# Patient Record
Sex: Female | Born: 1985 | Race: Black or African American | Hispanic: No | Marital: Single | Smoking: Never smoker
Health system: Southern US, Community
[De-identification: ages and names within clinical notes are randomized; demographics above are authoritative.]

## PROBLEM LIST (undated history)

## (undated) ENCOUNTER — Inpatient Hospital Stay (HOSPITAL_COMMUNITY): Payer: Self-pay

## (undated) DIAGNOSIS — Z973 Presence of spectacles and contact lenses: Secondary | ICD-10-CM

## (undated) DIAGNOSIS — Z87440 Personal history of urinary (tract) infections: Secondary | ICD-10-CM

## (undated) DIAGNOSIS — R011 Cardiac murmur, unspecified: Secondary | ICD-10-CM

## (undated) DIAGNOSIS — B009 Herpesviral infection, unspecified: Secondary | ICD-10-CM

## (undated) DIAGNOSIS — D649 Anemia, unspecified: Secondary | ICD-10-CM

## (undated) DIAGNOSIS — R000 Tachycardia, unspecified: Secondary | ICD-10-CM

## (undated) DIAGNOSIS — O139 Gestational [pregnancy-induced] hypertension without significant proteinuria, unspecified trimester: Secondary | ICD-10-CM

## (undated) DIAGNOSIS — I10 Essential (primary) hypertension: Secondary | ICD-10-CM

## (undated) DIAGNOSIS — N920 Excessive and frequent menstruation with regular cycle: Secondary | ICD-10-CM

## (undated) DIAGNOSIS — Z8619 Personal history of other infectious and parasitic diseases: Secondary | ICD-10-CM

## (undated) HISTORY — PX: EXTERNAL EAR SURGERY: SHX627

## (undated) HISTORY — DX: Gestational (pregnancy-induced) hypertension without significant proteinuria, unspecified trimester: O13.9

## (undated) HISTORY — DX: Tachycardia, unspecified: R00.0

## (undated) HISTORY — DX: Personal history of other infectious and parasitic diseases: Z86.19

## (undated) HISTORY — DX: Essential (primary) hypertension: I10

## (undated) HISTORY — DX: Cardiac murmur, unspecified: R01.1

## (undated) HISTORY — DX: Personal history of urinary (tract) infections: Z87.440

---

## 2010-10-23 ENCOUNTER — Emergency Department (HOSPITAL_COMMUNITY)
Admission: EM | Admit: 2010-10-23 | Discharge: 2010-10-23 | Discharge status: Against Medical Advice | Payer: Medicaid Other | Attending: Emergency Medicine | Admitting: Emergency Medicine

## 2010-10-23 DIAGNOSIS — R109 Unspecified abdominal pain: Secondary | ICD-10-CM | POA: Insufficient documentation

## 2010-10-23 DIAGNOSIS — N898 Other specified noninflammatory disorders of vagina: Secondary | ICD-10-CM | POA: Insufficient documentation

## 2010-10-23 LAB — URINE MICROSCOPIC-ADD ON

## 2010-10-23 LAB — URINALYSIS, ROUTINE W REFLEX MICROSCOPIC
Bilirubin Urine: NEGATIVE
Glucose, UA: NEGATIVE mg/dL
Hgb urine dipstick: NEGATIVE
Ketones, ur: 15 mg/dL — AB
Nitrite: NEGATIVE
Protein, ur: NEGATIVE mg/dL
Specific Gravity, Urine: 1.028 (ref 1.005–1.030)
Urobilinogen, UA: 1 mg/dL (ref 0.0–1.0)
pH: 6.5 (ref 5.0–8.0)

## 2010-10-23 LAB — POCT PREGNANCY, URINE: Preg Test, Ur: NEGATIVE

## 2011-04-05 ENCOUNTER — Emergency Department (HOSPITAL_COMMUNITY)
Admission: EM | Admit: 2011-04-05 | Discharge: 2011-04-05 | Disposition: A | Discharge status: Home / Self Care | Payer: Self-pay | Attending: Emergency Medicine | Admitting: Emergency Medicine

## 2011-04-05 ENCOUNTER — Encounter (HOSPITAL_COMMUNITY): Payer: Self-pay | Admitting: Emergency Medicine

## 2011-04-05 DIAGNOSIS — H5789 Other specified disorders of eye and adnexa: Secondary | ICD-10-CM | POA: Insufficient documentation

## 2011-04-05 DIAGNOSIS — L259 Unspecified contact dermatitis, unspecified cause: Secondary | ICD-10-CM | POA: Insufficient documentation

## 2011-04-05 DIAGNOSIS — L239 Allergic contact dermatitis, unspecified cause: Secondary | ICD-10-CM

## 2011-04-05 MED ORDER — CETIRIZINE-PSEUDOEPHEDRINE ER 5-120 MG PO TB12: 1.0000 | ORAL_TABLET | Freq: Every day | ORAL | Status: AC

## 2011-04-05 NOTE — Discharge Instructions (Signed)
FOLLOW UP WITH DR. Vonna Kotyk FOR ANY PERSISTENT SYMPTOMS. TAKE ZYRTEC AS DIRECTED FOR SYMPTOMS. RETURN HERE WITH ANY SUDDEN WORSENING OF SYMPTOMS TO INCLUDE EYE PAIN, VISUAL CHANGES OR OTHER CONCERN.

## 2011-04-05 NOTE — ED Provider Notes (Signed)
History     CSN: [id number]  Arrival date & time 04/05/11  1145   First MD Initiated Contact with Patient 04/05/11 1231      Chief Complaint  Patient presents with  . Eye Problem    Left    (Consider location/radiation/quality/duration/timing/severity/associated sxs/prior treatment) Patient is a 26 y.o. female presenting with eye problem. The history is provided by the patient.  Eye Problem  This is a new problem. The current episode started 12 to 24 hours ago. The problem occurs constantly. The problem has been gradually worsening. There is pain in the left eye. There was no injury mechanism. There is no history of trauma to the eye. There is no known exposure to pink eye. She does not wear contacts. Pertinent negatives include no blurred vision, no decreased vision, no discharge, no foreign body sensation, no photophobia, no eye redness and no itching. Associated symptoms comments: She describes sensation of swelling or fullness to left upper eye lid. No symptoms of the eye itself. There is no pain or itching. She took Benadryl yesterday x 1 without change in symptoms. No other complaint. No rash..    History reviewed. No pertinent past medical history.  History reviewed. No pertinent past surgical history.  Family History  Problem Relation Age of Onset  . Hypertension Mother     History  Substance Use Topics  . Smoking status: Never Smoker   . Smokeless tobacco: Not on file  . Alcohol Use: Yes    OB History    Grav Para Term Preterm Abortions TAB SAB Ect Mult Living                  Review of Systems  Constitutional: Negative.   HENT: Negative for ear pain, congestion, rhinorrhea and ear discharge.   Eyes: Negative for blurred vision, photophobia, discharge and redness.       See HPI.  Skin: Negative for itching.    Allergies  Review of patient's allergies indicates no known allergies.  Home Medications   Current Outpatient Rx  Name Route Sig Dispense  Refill  . DIPHENHYDRAMINE HCL 25 MG PO CAPS Oral Take 25 mg by mouth every 6 (six) hours as needed. allergies    . OVER THE COUNTER MEDICATION        BP 126/76  Pulse 75  Temp(Src) 98.6 F (37 C) (Oral)  Resp 18  SpO2 98%  LMP 04/05/2011  Physical Exam  Constitutional: She is oriented to person, place, and time. She appears well-developed and well-nourished.  Eyes: Conjunctivae and EOM are normal. Pupils are equal, round, and reactive to light.       Slight swelling without redness or rash to left upper eye lid. No palpable mass, cyst or other abnormality.  Neck: Normal range of motion.  Pulmonary/Chest: Effort normal.  Neurological: She is alert and oriented to person, place, and time.  Skin: Skin is warm and dry.    ED Course  Procedures (including critical care time)  Labs Reviewed - No data to display No results found.   No diagnosis found.    MDM  No abnormality of the eye with the exception of mildly swollen upper lid. Favor allergic component over infection. Will treat with Zyrtec, and refer to ophtho for persistent symtpoms.        Rodena Medin, PA-C 04/05/11 1353

## 2011-04-07 NOTE — Progress Notes (Signed)
On 04/05/11 Pt listed as self pay with no insurance coverage Pt confirms she is self pay [place] resident CM and [facility] coordinator spoke with her Pt offered [facility] services to assist with finding a [place] self pay provider Pt accepted information

## 2011-04-22 NOTE — ED Provider Notes (Signed)
Evaluation and management procedures were performed by the PA/NP under my supervision/collaboration.   Lamount Bankson D Malasha Kleppe, MD 04/22/11 1014 

## 2012-04-11 ENCOUNTER — Emergency Department (HOSPITAL_COMMUNITY)
Admission: EM | Admit: 2012-04-11 | Discharge: 2012-04-11 | Disposition: A | Discharge status: Home / Self Care | Payer: Self-pay

## 2012-05-20 ENCOUNTER — Encounter (HOSPITAL_COMMUNITY): Payer: Self-pay | Admitting: *Deleted

## 2012-05-20 ENCOUNTER — Emergency Department (HOSPITAL_COMMUNITY)
Admission: EM | Admit: 2012-05-20 | Discharge: 2012-05-21 | Disposition: A | Discharge status: Home / Self Care | Payer: No Typology Code available for payment source | Attending: Emergency Medicine | Admitting: Emergency Medicine

## 2012-05-20 DIAGNOSIS — S4980XA Other specified injuries of shoulder and upper arm, unspecified arm, initial encounter: Secondary | ICD-10-CM | POA: Insufficient documentation

## 2012-05-20 DIAGNOSIS — N6489 Other specified disorders of breast: Secondary | ICD-10-CM

## 2012-05-20 DIAGNOSIS — Y92410 Unspecified street and highway as the place of occurrence of the external cause: Secondary | ICD-10-CM | POA: Insufficient documentation

## 2012-05-20 DIAGNOSIS — M79602 Pain in left arm: Secondary | ICD-10-CM

## 2012-05-20 DIAGNOSIS — Y939 Activity, unspecified: Secondary | ICD-10-CM | POA: Insufficient documentation

## 2012-05-20 DIAGNOSIS — S46909A Unspecified injury of unspecified muscle, fascia and tendon at shoulder and upper arm level, unspecified arm, initial encounter: Secondary | ICD-10-CM | POA: Insufficient documentation

## 2012-05-20 DIAGNOSIS — S2000XA Contusion of breast, unspecified breast, initial encounter: Secondary | ICD-10-CM | POA: Insufficient documentation

## 2012-05-20 MED ORDER — NAPROXEN 500 MG PO TABS: 500.0000 mg | ORAL_TABLET | Freq: Two times a day (BID) | ORAL | Status: DC

## 2012-05-20 MED ORDER — TRAMADOL HCL 50 MG PO TABS: 50.0000 mg | ORAL_TABLET | Freq: Four times a day (QID) | ORAL | Status: DC | PRN
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
## 2012-05-22 NOTE — ED Provider Notes (Signed)
Medical screening examination/treatment/procedure(s) were performed by non-physician practitioner and as supervising physician I was immediately available for consultation/collaboration.  Brian Opitz, MD 05/22/12 1814 

## 2012-08-16 ENCOUNTER — Encounter: Payer: Self-pay | Admitting: Obstetrics & Gynecology

## 2012-09-09 ENCOUNTER — Emergency Department (HOSPITAL_COMMUNITY)
Admission: EM | Admit: 2012-09-09 | Discharge: 2012-09-10 | Disposition: A | Discharge status: Home / Self Care | Payer: BC Managed Care – PPO | Attending: Emergency Medicine | Admitting: Emergency Medicine

## 2012-09-09 DIAGNOSIS — Z79899 Other long term (current) drug therapy: Secondary | ICD-10-CM | POA: Insufficient documentation

## 2012-09-09 DIAGNOSIS — M549 Dorsalgia, unspecified: Secondary | ICD-10-CM | POA: Insufficient documentation

## 2012-09-09 DIAGNOSIS — N390 Urinary tract infection, site not specified: Secondary | ICD-10-CM | POA: Insufficient documentation

## 2012-09-09 DIAGNOSIS — Z3202 Encounter for pregnancy test, result negative: Secondary | ICD-10-CM | POA: Insufficient documentation

## 2012-09-09 DIAGNOSIS — R300 Dysuria: Secondary | ICD-10-CM | POA: Insufficient documentation

## 2012-09-10 ENCOUNTER — Encounter (HOSPITAL_COMMUNITY): Payer: Self-pay

## 2012-09-10 LAB — URINE MICROSCOPIC-ADD ON

## 2012-09-10 LAB — URINALYSIS, ROUTINE W REFLEX MICROSCOPIC
Bilirubin Urine: NEGATIVE
Glucose, UA: NEGATIVE mg/dL
Hgb urine dipstick: NEGATIVE
Ketones, ur: NEGATIVE mg/dL
Nitrite: NEGATIVE
Protein, ur: NEGATIVE mg/dL
Specific Gravity, Urine: 1.032 — ABNORMAL HIGH (ref 1.005–1.030)
Urobilinogen, UA: 1 mg/dL (ref 0.0–1.0)
pH: 5.5 (ref 5.0–8.0)

## 2012-09-10 LAB — POCT PREGNANCY, URINE: Preg Test, Ur: NEGATIVE

## 2012-09-10 MED ORDER — PHENAZOPYRIDINE HCL 200 MG PO TABS: 200.0000 mg | ORAL_TABLET | Freq: Three times a day (TID) | ORAL | Status: DC | PRN

## 2012-09-10 MED ORDER — NITROFURANTOIN MONOHYD MACRO 100 MG PO CAPS: 100.0000 mg | ORAL_CAPSULE | Freq: Two times a day (BID) | ORAL | Status: DC

## 2012-09-10 NOTE — ED Provider Notes (Signed)
CSN: [id number]     Arrival date & time 09/09/12  2356 History     First MD Initiated Contact with Patient 09/10/12 0113     Chief Complaint  Patient presents with  . Flank Pain   HPI  History provided by the patient. Patient is a 27 year old female with no significant PMH who presents with complaints of pain and ache in her left lower back and side. Symptoms have been present for the past 2 weeks. She states there were very mild. They do not seem sniffily worse with any particular movements or position. Over the past one to 2 days she began having a dysuria associated with the back pain and discomfort. She denies similar symptoms previously. She is concerned for possible urine infection. She denies any fever, chills or sweats. No nausea, vomiting, diarrhea or constipation. She reports having slight vaginal discharge that is mild and clear. Denies any vaginal pain or bleeding. No menstrual changes. No other aggravating or alleviating factors.    History reviewed. No pertinent past medical history. Past Surgical History  Procedure Laterality Date  . External ear surgery      keloids   Family History  Problem Relation Age of Onset  . Hypertension Mother    History  Substance Use Topics  . Smoking status: Never Smoker   . Smokeless tobacco: Not on file  . Alcohol Use: Yes     Comment: social   OB History   Grav Para Term Preterm Abortions TAB SAB Ect Mult Living                 Review of Systems  Constitutional: Negative for fever, chills and diaphoresis.  Gastrointestinal: Negative for nausea, vomiting, abdominal pain, diarrhea and constipation.  Genitourinary: Positive for dysuria and flank pain. Negative for frequency, hematuria, vaginal bleeding, menstrual problem and pelvic pain.  Musculoskeletal: Positive for back pain.  All other systems reviewed and are negative.    Allergies  Review of patient's allergies indicates no known allergies.  Home Medications    Current Outpatient Rx  Name  Route  Sig  Dispense  Refill  . acidophilus (RISAQUAD) CAPS capsule   Oral   Take 1 capsule by mouth every morning.         . fluconazole (DIFLUCAN) 150 MG tablet   Oral   Take 150 mg by mouth once.          BP 121/70  Pulse 87  Temp(Src) 99.8 F (37.7 C) (Oral)  Resp 18  SpO2 100%  LMP 09/03/2012 Physical Exam  Nursing note and vitals reviewed. Constitutional: She is oriented to person, place, and time. She appears well-developed and well-nourished. No distress.  HENT:  Head: Normocephalic.  Cardiovascular: Normal rate and regular rhythm.   Pulmonary/Chest: Effort normal and breath sounds normal. No respiratory distress. She has no wheezes. She has no rales.  Abdominal: Soft. She exhibits no distension. There is no tenderness. There is no rebound and no guarding.  No significant CVA tenderness  Musculoskeletal: Normal range of motion. She exhibits no edema and no tenderness.  No spinal or other back pain  Neurological: She is alert and oriented to person, place, and time.  Skin: Skin is warm and dry. No rash noted.  Psychiatric: She has a normal mood and affect. Her behavior is normal.    ED Course   Procedures   Results for orders placed during the hospital encounter of 09/09/12  URINALYSIS, ROUTINE W REFLEX MICROSCOPIC  Result Value Range   Color, Urine YELLOW  YELLOW   APPearance CLOUDY (*) CLEAR   Specific Gravity, Urine 1.032 (*) 1.005 - 1.030   pH 5.5  5.0 - 8.0   Glucose, UA NEGATIVE  NEGATIVE mg/dL   Hgb urine dipstick NEGATIVE  NEGATIVE   Bilirubin Urine NEGATIVE  NEGATIVE   Ketones, ur NEGATIVE  NEGATIVE mg/dL   Protein, ur NEGATIVE  NEGATIVE mg/dL   Urobilinogen, UA 1.0  0.0 - 1.0 mg/dL   Nitrite NEGATIVE  NEGATIVE   Leukocytes, UA MODERATE (*) NEGATIVE  URINE MICROSCOPIC-ADD ON      Result Value Range   Squamous Epithelial / LPF FEW (*) RARE   WBC, UA 21-50  <3 WBC/hpf   Bacteria, UA FEW (*) RARE    Urine-Other MUCOUS PRESENT    POCT PREGNANCY, URINE      Result Value Range   Preg Test, Ur NEGATIVE  NEGATIVE      1. UTI (lower urinary tract infection)     MDM  Patient seen and evaluated. The patient appears well in no acute distress. She does not appear severely ill or toxic. She has some mild left flank discomforts with dysuria. Patient states she was tested for STDs with her OB/GYN earlier this month has not been essentially active since that time does not wish for a pelvic examination as part of her workup.  Angus Seller, PA-C 09/10/12 [phone]

## 2012-09-10 NOTE — ED Notes (Signed)
Pt states left flank pain for 2 weeks.  Today started having pain with urination.  Slight fever in triage.  Slight discharge.  No n/v/d.  Did not have fever at home.

## 2012-09-10 NOTE — ED Provider Notes (Signed)
Medical screening examination/treatment/procedure(s) were performed by non-physician practitioner and as supervising physician I was immediately available for consultation/collaboration.  Olga M Otter, MD 09/10/12 0607 

## 2012-09-11 LAB — URINE CULTURE: Colony Count: 90000

## 2012-12-12 LAB — OB RESULTS CONSOLE ANTIBODY SCREEN: Antibody Screen: NEGATIVE

## 2012-12-12 LAB — OB RESULTS CONSOLE ABO/RH: RH Type: POSITIVE

## 2012-12-12 LAB — OB RESULTS CONSOLE VARICELLA ZOSTER ANTIBODY, IGG: Varicella: NON-IMMUNE/NOT IMMUNE

## 2012-12-12 LAB — OB RESULTS CONSOLE HIV ANTIBODY (ROUTINE TESTING): HIV: NONREACTIVE

## 2012-12-12 LAB — OB RESULTS CONSOLE RPR: RPR: NONREACTIVE

## 2012-12-12 LAB — OB RESULTS CONSOLE HEPATITIS B SURFACE ANTIGEN: Hepatitis B Surface Ag: NEGATIVE

## 2012-12-12 LAB — OB RESULTS CONSOLE RUBELLA ANTIBODY, IGM: Rubella: IMMUNE

## 2013-01-22 ENCOUNTER — Other Ambulatory Visit (HOSPITAL_COMMUNITY): Payer: Self-pay | Admitting: Nurse Practitioner

## 2013-01-22 DIAGNOSIS — Z3689 Encounter for other specified antenatal screening: Secondary | ICD-10-CM

## 2013-01-25 NOTE — L&D Delivery Note (Signed)
Delivery Note Called to patient room for delivery. Patient pushed over intact perineum. At 1:09 AM a viable female was delivered via Vaginal, Spontaneous Delivery (Presentation: ; Occiput Anterior).  APGAR: 8, 9; weight pending. Cord clamped x 2 and cut. Active management 3rd stage with traction, Placenta status: Intact, Spontaneous.  Cord: 3 vessel  with the following complications: None.  1st degree perineal/sulcal tear repaired in usual fashion as noted below, hemostatic. Cord pH: n/a. Counts correct. Hemostatic. CNM present for delivery.   Anesthesia: Epidural  Episiotomy: None Lacerations: 1st degree;Perineal Suture Repair: 2.0 vicryl, Est. Blood Loss (mL): 200  Mom to postpartum.  Baby to Couplet care / Skin to Skin.  Emeterio Reeve 07/16/2013, 1:31 AM

## 2013-01-25 NOTE — L&D Delivery Note (Signed)
Agree with asessment

## 2013-02-20 ENCOUNTER — Emergency Department (HOSPITAL_COMMUNITY)
Admission: AD | Admit: 2013-02-20 | Discharge: 2013-02-20 | Disposition: A | Discharge status: Home / Self Care | Payer: BC Managed Care – PPO | Source: Ambulatory Visit | Attending: Obstetrics & Gynecology | Admitting: Obstetrics & Gynecology

## 2013-02-20 ENCOUNTER — Encounter (HOSPITAL_COMMUNITY): Payer: Self-pay | Admitting: *Deleted

## 2013-02-20 ENCOUNTER — Encounter (HOSPITAL_COMMUNITY): Payer: Self-pay | Admitting: Emergency Medicine

## 2013-02-20 ENCOUNTER — Inpatient Hospital Stay (EMERGENCY_DEPARTMENT_HOSPITAL)
Admission: AD | Admit: 2013-02-20 | Discharge: 2013-02-20 | Disposition: A | Discharge status: Home / Self Care | Payer: BC Managed Care – PPO | Source: Ambulatory Visit | Attending: Obstetrics & Gynecology | Admitting: Obstetrics & Gynecology

## 2013-02-20 DIAGNOSIS — R1013 Epigastric pain: Secondary | ICD-10-CM

## 2013-02-20 DIAGNOSIS — O209 Hemorrhage in early pregnancy, unspecified: Secondary | ICD-10-CM | POA: Insufficient documentation

## 2013-02-20 DIAGNOSIS — O2390 Unspecified genitourinary tract infection in pregnancy, unspecified trimester: Secondary | ICD-10-CM | POA: Insufficient documentation

## 2013-02-20 DIAGNOSIS — R109 Unspecified abdominal pain: Secondary | ICD-10-CM | POA: Insufficient documentation

## 2013-02-20 DIAGNOSIS — O9989 Other specified diseases and conditions complicating pregnancy, childbirth and the puerperium: Secondary | ICD-10-CM

## 2013-02-20 DIAGNOSIS — B373 Candidiasis of vulva and vagina: Secondary | ICD-10-CM | POA: Insufficient documentation

## 2013-02-20 DIAGNOSIS — B3731 Acute candidiasis of vulva and vagina: Secondary | ICD-10-CM | POA: Insufficient documentation

## 2013-02-20 LAB — URINE MICROSCOPIC-ADD ON

## 2013-02-20 LAB — COMPREHENSIVE METABOLIC PANEL
ALT: 31 U/L (ref 0–35)
AST: 36 U/L (ref 0–37)
Albumin: 3 g/dL — ABNORMAL LOW (ref 3.5–5.2)
Alkaline Phosphatase: 83 U/L (ref 39–117)
BUN: 9 mg/dL (ref 6–23)
CO2: 22 mEq/L (ref 19–32)
Calcium: 9.1 mg/dL (ref 8.4–10.5)
Chloride: 104 mEq/L (ref 96–112)
Creatinine, Ser: 0.63 mg/dL (ref 0.50–1.10)
GFR calc Af Amer: >90 mL/min (ref >90)
GFR calc non Af Amer: >90 mL/min (ref >90)
Glucose, Bld: 86 mg/dL (ref 70–99)
Potassium: 4 mEq/L (ref 3.7–5.3)
Sodium: 139 mEq/L (ref 137–147)
Total Bilirubin: <0.2 mg/dL — ABNORMAL LOW (ref 0.3–1.2)
Total Protein: 7.8 g/dL (ref 6.0–8.3)

## 2013-02-20 LAB — CBC WITH DIFFERENTIAL/PLATELET
Basophils Absolute: 0 10*3/uL (ref 0.0–0.1)
Basophils Relative: 0 % (ref 0–1)
Eosinophils Absolute: 0.1 10*3/uL (ref 0.0–0.7)
Eosinophils Relative: 1 % (ref 0–5)
HCT: 31.3 % — ABNORMAL LOW (ref 36.0–46.0)
Hemoglobin: 9.3 g/dL — ABNORMAL LOW (ref 12.0–15.0)
Lymphocytes Relative: 13 % (ref 12–46)
Lymphs Abs: 1.1 10*3/uL (ref 0.7–4.0)
MCH: 17 pg — ABNORMAL LOW (ref 26.0–34.0)
MCHC: 29.7 g/dL — ABNORMAL LOW (ref 30.0–36.0)
MCV: 57.1 fL — ABNORMAL LOW (ref 78.0–100.0)
Monocytes Absolute: 0.6 10*3/uL (ref 0.1–1.0)
Monocytes Relative: 7 % (ref 3–12)
Neutro Abs: 6.8 10*3/uL (ref 1.7–7.7)
Neutrophils Relative %: 79 % — ABNORMAL HIGH (ref 43–77)
Platelets: 303 10*3/uL (ref 150–400)
RBC: 5.48 MIL/uL — ABNORMAL HIGH (ref 3.87–5.11)
RDW: 23.2 % — ABNORMAL HIGH (ref 11.5–15.5)
WBC: 8.6 10*3/uL (ref 4.0–10.5)

## 2013-02-20 LAB — URINALYSIS, ROUTINE W REFLEX MICROSCOPIC
Bilirubin Urine: NEGATIVE
Glucose, UA: NEGATIVE mg/dL
Ketones, ur: NEGATIVE mg/dL
Nitrite: NEGATIVE
Protein, ur: NEGATIVE mg/dL
Specific Gravity, Urine: 1.03 (ref 1.005–1.030)
Urobilinogen, UA: 2 mg/dL — ABNORMAL HIGH (ref 0.0–1.0)
pH: 6 (ref 5.0–8.0)

## 2013-02-20 LAB — POCT PREGNANCY, URINE: Preg Test, Ur: POSITIVE — AB

## 2013-02-20 NOTE — ED Notes (Addendum)
Pt reports [redacted] weeks pregnant, started having vaginal bleeding/ spotting starting today. Upper abdominal cramping 10/10.  Reports first pregnancy.

## 2013-02-20 NOTE — ED Notes (Signed)
Pt states that she has been seen at health dept and told she is [redacted] weeks pregnant.  Today while at work she started having upper abd pain "like a bad menstrual period but in wrong place" and then noticed vaginal small amounts of pink blood.

## 2013-02-20 NOTE — MAU Note (Signed)
PT SAYS SHE WAS SEEN AT [facility]   ON  02-02-2013.  HAS AN APPOINTMENT  HERE FOR U/S ON MON.   SAYS  SHE HAS UPPER ABD  PAIN - THAT STARTED  ON Sunday NIGHT - WENT AWAY.  THEN NO PAIN ON Monday.  BUT TODAY  AT 330PM - WHILE AT WORK - THE  PAIN CAME BACK-  NO MEDS TAKEN.    PAIN IS LESS NOW THAN AT 330PM.    DENIES N/V/D.     SAYS SHE  HAS BEEN SPOTTING  SINCE 1-20-  LIGHT PINK.Marland Kitchen HAS CONTINUED - HAS ON PANTY LINER.     LAST SEX- 1-13.      IN TRIAGE - ON PANTY LINER - NOTHING-  BECAUSE SHE SAYS SHE CHANGED IT  WHILE  SHE WAS [facility]-  SHE WAS THERE FROM 4- 630-  THEY DREW BLOOD AND  DID VITALS.

## 2013-02-20 NOTE — Discharge Instructions (Signed)
Heartburn During Pregnancy  Heartburn happens when stomach acid goes up into the esophagus. The esophagus is the tube between the mouth and the stomach. This acid causes a burning pain in the chest or throat. This happens more often in the later part of pregnancy because the womb (uterus) gets larger. It may also happen because of hormone changes. Heartburn problems often go away after giving birth. HOME CARE  Take all medicine as told by your doctor.  Raise the head of your bed with blocks only as told by your doctor.  Do not exercise right after eating.  Avoid eating 2 3 hours before bed. Do not lie down right after eating.  Eat small meals throughout the day instead of 3 large meals.  Avoid foods that give you heartburn. Foods you may want to avoid include:  Peppers.  Chocolate.  High-fat foods, including fried foods.  Spicy foods.  Garlic and onions.  Citrus fruits, including oranges, grapefruit, lemons, and limes.  Food containing tomatoes or tomato products.  Mint.  Bubbly (carbonated) drinks and drinks with caffeine.  Vinegar. GET HELP IF:  You have any belly (abdominal) pain.  You feel burning in your upper belly or chest, especially after eating or lying down.  You feel sick to your stomach (nauseous) and throw up (vomit).  Your stomach feels upset after you eat. GET HELP RIGHT AWAY IF:  You have bad chest pain that goes down your arm or into your jaw or neck.  You feel sweaty, dizzy, or lightheaded.  You have trouble breathing.  You throw up blood.  You have trouble or pain when swallowing.  You have bloody or black poop (stool).  You have heartburn more than 3 times a week, for more than 2 weeks. MAKE SURE YOU:  Understand these instructions.  Will watch your condition.  Will get help right away if you are not doing well or get worse. Document Released: 02/13/2010 Document Revised: 11/01/2012 Document Reviewed: 08/30/2012 [facility] Patient  Information 2014 [place], [place].

## 2013-02-20 NOTE — MAU Provider Note (Signed)
History     CSN: [id number]  Arrival date and time: 02/20/13 1901   First Provider Initiated Contact with Patient 02/20/13 2117      Chief Complaint  Patient presents with  . Vaginal Bleeding  . Abdominal Pain   HPI [name] is a 28 y.o. G2P0010 at [redacted]w[redacted]d who presents to MAU today with complaint of upper abdominal pain and vaginal spotting. The patient gets prenatal care at [facility]. She states that she noted sharp upper abdominal pains earlier today. She denies heartburn, reflux, N/V/D or constipation, change in appetite or fever. She states that the pain has resolved at this time. She went to [facility] first and had blood drawn and left before being seen. The patient also noted occasional spotting with wiping. She is currently being treated for a yeast infection with vaginal Terazol cream. Last intercourse was 2 weeks ago. Last appointment at [facility] on 02/02/13. Next appointment is next week.   OB History   Grav Para Term Preterm Abortions TAB SAB Ect Mult Living   2    1 1           History reviewed. No pertinent past medical history.  Past Surgical History  Procedure Laterality Date  . External ear surgery      keloids    Family History  Problem Relation Age of Onset  . Hypertension Mother     History  Substance Use Topics  . Smoking status: Never Smoker   . Smokeless tobacco: Not on file  . Alcohol Use: No     Comment: social    Allergies: No Known Allergies  Prescriptions prior to admission  Medication Sig Dispense Refill  . Prenatal Vit-Fe Fumarate-FA (PRENATAL MULTIVITAMIN) TABS tablet Take 1 tablet by mouth daily at 12 noon.      Marland Kitchen terconazole (TERAZOL 7) 0.4 % vaginal cream Place 1 applicator vaginally at bedtime.        Review of Systems  Constitutional: Negative for fever and malaise/fatigue.  Gastrointestinal: Positive for abdominal pain. Negative for nausea, vomiting, diarrhea and constipation.  Genitourinary: Negative for dysuria, urgency and  frequency.       + vaginal bleeding Neg - vaginal discharge  Neurological: Positive for dizziness. Negative for loss of consciousness.   Physical Exam   Blood pressure 122/63, pulse 79, temperature 98.2 F (36.8 C), temperature source Oral, resp. rate 20, height 4\' 11"  (1.499 m), weight 198 lb 8 oz (90.039 kg), last menstrual period 10/15/2012.  Physical Exam  Constitutional: She is oriented to person, place, and time. She appears well-developed and well-nourished. No distress.  HENT:  Head: Normocephalic and atraumatic.  Cardiovascular: Normal rate, regular rhythm and normal heart sounds.   Respiratory: Effort normal and breath sounds normal. No respiratory distress.  GI: Soft. Bowel sounds are normal. She exhibits no distension and no mass. There is no tenderness. There is no rebound and no guarding.  Neurological: She is alert and oriented to person, place, and time.  Skin: Skin is warm and dry. No erythema.  Psychiatric: She has a normal mood and affect.   Results for orders placed during the hospital encounter of 02/20/13 (from the past 24 hour(s))  URINALYSIS, ROUTINE W REFLEX MICROSCOPIC     Status: Abnormal   Collection Time    02/20/13  5:46 PM      Result Value Range   Color, Urine AMBER (*) YELLOW   APPearance CLOUDY (*) CLEAR   Specific Gravity, Urine 1.030  1.005 - 1.030  pH 6.0  5.0 - 8.0   Glucose, UA NEGATIVE  NEGATIVE mg/dL   Hgb urine dipstick TRACE (*) NEGATIVE   Bilirubin Urine NEGATIVE  NEGATIVE   Ketones, ur NEGATIVE  NEGATIVE mg/dL   Protein, ur NEGATIVE  NEGATIVE mg/dL   Urobilinogen, UA 2.0 (*) 0.0 - 1.0 mg/dL   Nitrite NEGATIVE  NEGATIVE   Leukocytes, UA LARGE (*) NEGATIVE  URINE MICROSCOPIC-ADD ON     Status: Abnormal   Collection Time    02/20/13  5:46 PM      Result Value Range   Squamous Epithelial / LPF MANY (*) RARE   WBC, UA 21-50  <3 WBC/hpf   RBC / HPF 0-2  <3 RBC/hpf   Bacteria, UA RARE  RARE   Crystals CA OXALATE CRYSTALS (*)  NEGATIVE  POCT PREGNANCY, URINE     Status: Abnormal   Collection Time    02/20/13  5:47 PM      Result Value Range   Preg Test, Ur POSITIVE (*) NEGATIVE  CBC WITH DIFFERENTIAL     Status: Abnormal   Collection Time    02/20/13  6:01 PM      Result Value Range   WBC 8.6  4.0 - 10.5 K/uL   RBC 5.48 (*) 3.87 - 5.11 MIL/uL   Hemoglobin 9.3 (*) 12.0 - 15.0 g/dL   HCT 31.3 (*) 36.0 - 46.0 %   MCV 57.1 (*) 78.0 - 100.0 fL   MCH 17.0 (*) 26.0 - 34.0 pg   MCHC 29.7 (*) 30.0 - 36.0 g/dL   RDW 23.2 (*) 11.5 - 15.5 %   Platelets 303  150 - 400 K/uL   Neutrophils Relative % 79 (*) 43 - 77 %   Lymphocytes Relative 13  12 - 46 %   Monocytes Relative 7  3 - 12 %   Eosinophils Relative 1  0 - 5 %   Basophils Relative 0  0 - 1 %   Neutro Abs 6.8  1.7 - 7.7 K/uL   Lymphs Abs 1.1  0.7 - 4.0 K/uL   Monocytes Absolute 0.6  0.1 - 1.0 K/uL   Eosinophils Absolute 0.1  0.0 - 0.7 K/uL   Basophils Absolute 0.0  0.0 - 0.1 K/uL   RBC Morphology POLYCHROMASIA PRESENT     Smear Review PLATELET COUNT CONFIRMED BY SMEAR    COMPREHENSIVE METABOLIC PANEL     Status: Abnormal   Collection Time    02/20/13  6:01 PM      Result Value Range   Sodium 139  137 - 147 mEq/L   Potassium 4.0  3.7 - 5.3 mEq/L   Chloride 104  96 - 112 mEq/L   CO2 22  19 - 32 mEq/L   Glucose, Bld 86  70 - 99 mg/dL   BUN 9  6 - 23 mg/dL   Creatinine, Ser 0.63  0.50 - 1.10 mg/dL   Calcium 9.1  8.4 - 10.5 mg/dL   Total Protein 7.8  6.0 - 8.3 g/dL   Albumin 3.0 (*) 3.5 - 5.2 g/dL   AST 36  0 - 37 U/L   ALT 31  0 - 35 U/L   Alkaline Phosphatase 83  39 - 117 U/L   Total Bilirubin <0.2 (*) 0.3 - 1.2 mg/dL   GFR calc non Af Amer >90  >90 mL/min   GFR calc Af Amer >90  >90 mL/min     MAU Course  Procedures None  MDM UA, CBC,  CMP  FHR - 153 bpm Pelvic exam deferred as patient is currently being treated with vaginal terazol cream Patient reassured of fetal well-being with doppler FHR Upper abdominal pain as resolved. LFTs were  normal on CMP. WBC normal on CBC today  Assessment and Plan  A: Yeast vulvovaginitis Vaginal bleeding in pregnancy , second trimester Abdominal pain, resolved  P: Discharge home Advised Pepcid or Zantac if upper abdominal pain returns Advised patient to complete course of Terazol cream as prescribed Keep scheduled follow-up for [place] and prenatal care with [facility] Bleeding precautions discussed Patient may return to MAU as needed or if her condition were to change or worsen  Farris Has, PA-C  02/20/2013, 9:17 PM

## 2013-02-26 ENCOUNTER — Ambulatory Visit (HOSPITAL_COMMUNITY)
Admission: RE | Admit: 2013-02-26 | Discharge: 2013-02-26 | Disposition: A | Discharge status: Home / Self Care | Payer: BC Managed Care – PPO | Source: Ambulatory Visit | Attending: Nurse Practitioner | Admitting: Nurse Practitioner

## 2013-02-26 DIAGNOSIS — Z3689 Encounter for other specified antenatal screening: Secondary | ICD-10-CM

## 2013-03-02 ENCOUNTER — Other Ambulatory Visit (HOSPITAL_COMMUNITY): Payer: Self-pay | Admitting: Nurse Practitioner

## 2013-03-02 DIAGNOSIS — Z0489 Encounter for examination and observation for other specified reasons: Secondary | ICD-10-CM

## 2013-03-02 DIAGNOSIS — IMO0002 Reserved for concepts with insufficient information to code with codable children: Secondary | ICD-10-CM

## 2013-03-11 LAB — OB RESULTS CONSOLE GC/CHLAMYDIA
Chlamydia: NEGATIVE
Gonorrhea: NEGATIVE

## 2013-04-02 ENCOUNTER — Other Ambulatory Visit (HOSPITAL_COMMUNITY): Payer: Self-pay | Admitting: Nurse Practitioner

## 2013-04-02 DIAGNOSIS — Z0489 Encounter for examination and observation for other specified reasons: Secondary | ICD-10-CM

## 2013-04-02 DIAGNOSIS — IMO0002 Reserved for concepts with insufficient information to code with codable children: Secondary | ICD-10-CM

## 2013-04-09 ENCOUNTER — Ambulatory Visit (HOSPITAL_COMMUNITY)
Admission: RE | Admit: 2013-04-09 | Discharge: 2013-04-09 | Disposition: A | Discharge status: Home / Self Care | Payer: BC Managed Care – PPO | Source: Ambulatory Visit | Attending: Nurse Practitioner | Admitting: Nurse Practitioner

## 2013-04-09 DIAGNOSIS — Z0489 Encounter for examination and observation for other specified reasons: Secondary | ICD-10-CM

## 2013-04-09 DIAGNOSIS — IMO0002 Reserved for concepts with insufficient information to code with codable children: Secondary | ICD-10-CM

## 2013-04-09 DIAGNOSIS — Z3689 Encounter for other specified antenatal screening: Secondary | ICD-10-CM | POA: Insufficient documentation

## 2013-05-16 ENCOUNTER — Other Ambulatory Visit (HOSPITAL_COMMUNITY): Payer: Self-pay | Admitting: Nurse Practitioner

## 2013-05-16 DIAGNOSIS — O26849 Uterine size-date discrepancy, unspecified trimester: Secondary | ICD-10-CM

## 2013-05-22 ENCOUNTER — Ambulatory Visit (HOSPITAL_COMMUNITY)
Admission: RE | Admit: 2013-05-22 | Discharge: 2013-05-22 | Disposition: A | Discharge status: Home / Self Care | Payer: BC Managed Care – PPO | Source: Ambulatory Visit | Attending: Nurse Practitioner | Admitting: Nurse Practitioner

## 2013-05-22 DIAGNOSIS — Z3689 Encounter for other specified antenatal screening: Secondary | ICD-10-CM | POA: Insufficient documentation

## 2013-05-22 DIAGNOSIS — O26849 Uterine size-date discrepancy, unspecified trimester: Secondary | ICD-10-CM | POA: Insufficient documentation

## 2013-06-28 LAB — OB RESULTS CONSOLE GBS: GBS: POSITIVE

## 2013-07-12 ENCOUNTER — Inpatient Hospital Stay (HOSPITAL_COMMUNITY)
Admission: AD | Admit: 2013-07-12 | Discharge: 2013-07-12 | Disposition: A | Discharge status: Home / Self Care | Payer: BC Managed Care – PPO | Source: Ambulatory Visit | Attending: Obstetrics & Gynecology | Admitting: Obstetrics & Gynecology

## 2013-07-12 ENCOUNTER — Encounter (HOSPITAL_COMMUNITY): Payer: Self-pay | Admitting: *Deleted

## 2013-07-12 DIAGNOSIS — O139 Gestational [pregnancy-induced] hypertension without significant proteinuria, unspecified trimester: Secondary | ICD-10-CM | POA: Insufficient documentation

## 2013-07-12 DIAGNOSIS — O133 Gestational [pregnancy-induced] hypertension without significant proteinuria, third trimester: Secondary | ICD-10-CM

## 2013-07-12 HISTORY — DX: Anemia, unspecified: D64.9

## 2013-07-12 HISTORY — DX: Herpesviral infection, unspecified: B00.9

## 2013-07-12 LAB — URINALYSIS, ROUTINE W REFLEX MICROSCOPIC
Bilirubin Urine: NEGATIVE
Glucose, UA: NEGATIVE mg/dL
Hgb urine dipstick: NEGATIVE
Ketones, ur: NEGATIVE mg/dL
Nitrite: NEGATIVE
Protein, ur: NEGATIVE mg/dL
Specific Gravity, Urine: 1.02 (ref 1.005–1.030)
Urobilinogen, UA: 1 mg/dL (ref 0.0–1.0)
pH: 6 (ref 5.0–8.0)

## 2013-07-12 LAB — CBC WITH DIFFERENTIAL/PLATELET
Basophils Absolute: 0 10*3/uL (ref 0.0–0.1)
Basophils Relative: 0 % (ref 0–1)
Eosinophils Absolute: 0.1 10*3/uL (ref 0.0–0.7)
Eosinophils Relative: 1 % (ref 0–5)
HCT: 30.3 % — ABNORMAL LOW (ref 36.0–46.0)
Hemoglobin: 8.9 g/dL — ABNORMAL LOW (ref 12.0–15.0)
Lymphocytes Relative: 15 % (ref 12–46)
Lymphs Abs: 1.2 10*3/uL (ref 0.7–4.0)
MCH: 19.3 pg — ABNORMAL LOW (ref 26.0–34.0)
MCHC: 29.4 g/dL — ABNORMAL LOW (ref 30.0–36.0)
MCV: 65.7 fL — ABNORMAL LOW (ref 78.0–100.0)
Monocytes Absolute: 0.6 10*3/uL (ref 0.1–1.0)
Monocytes Relative: 8 % (ref 3–12)
Neutro Abs: 5.8 10*3/uL (ref 1.7–7.7)
Neutrophils Relative %: 76 % (ref 43–77)
Platelets: 223 10*3/uL (ref 150–400)
RBC: 4.61 MIL/uL (ref 3.87–5.11)
RDW: 30.7 % — ABNORMAL HIGH (ref 11.5–15.5)
WBC: 7.6 10*3/uL (ref 4.0–10.5)

## 2013-07-12 LAB — PROTEIN / CREATININE RATIO, URINE
Creatinine, Urine: 255.07 mg/dL
Protein Creatinine Ratio: 0.09 (ref 0.00–0.15)
Total Protein, Urine: 22.6 mg/dL

## 2013-07-12 LAB — COMPREHENSIVE METABOLIC PANEL
ALT: 11 U/L (ref 0–35)
AST: 20 U/L (ref 0–37)
Albumin: 2.4 g/dL — ABNORMAL LOW (ref 3.5–5.2)
Alkaline Phosphatase: 162 U/L — ABNORMAL HIGH (ref 39–117)
BUN: 7 mg/dL (ref 6–23)
CO2: 22 mEq/L (ref 19–32)
Calcium: 8.4 mg/dL (ref 8.4–10.5)
Chloride: 102 mEq/L (ref 96–112)
Creatinine, Ser: 0.61 mg/dL (ref 0.50–1.10)
GFR calc Af Amer: >90 mL/min (ref >90)
GFR calc non Af Amer: >90 mL/min (ref >90)
Glucose, Bld: 86 mg/dL (ref 70–99)
Potassium: 3.8 mEq/L (ref 3.7–5.3)
Sodium: 136 mEq/L — ABNORMAL LOW (ref 137–147)
Total Bilirubin: 0.3 mg/dL (ref 0.3–1.2)
Total Protein: 6.1 g/dL (ref 6.0–8.3)

## 2013-07-12 LAB — URINE MICROSCOPIC-ADD ON

## 2013-07-12 NOTE — Discharge Instructions (Signed)

## 2013-07-12 NOTE — MAU Provider Note (Signed)
Chief Complaint:  Hypertension   Caroline Jordan is a 28 y.o.  G1P0000 with IUP at [redacted]w[redacted]d presenting for Hypertension Patient receives care at the [facility]. She had an elevated BP 140's/90's today and was sent to MAU for evaluation. She denies HA, visual changes, RUQ pain but does have swelling in her LE but nothing acutely changed.   Is taking valtrex for HSV infection.    Denies LOF or VB with Good FM.   Menstrual History: OB History   Grav Para Term Preterm Abortions TAB SAB Ect Mult Living   1    0 0          Patient's last menstrual period was 10/15/2012.      Past Medical History  Diagnosis Date  . Anemia   . HSV infection     Past Surgical History  Procedure Laterality Date  . External ear surgery      keloids    Family History  Problem Relation Age of Onset  . Hypertension Mother     History  Substance Use Topics  . Smoking status: Never Smoker   . Smokeless tobacco: Not on file  . Alcohol Use: No     Comment: social     No Known Allergies  No prescriptions prior to admission    Review of Systems - Negative except for what is mentioned in HPI.  Physical Exam  Blood pressure 129/94, pulse 89, temperature 98.2 F (36.8 C), temperature source Oral, resp. rate 18, height 4\' 11"  (1.499 m), weight 98.521 kg (217 lb 3.2 oz), last menstrual period 10/15/2012. GENERAL: Well-developed, well-nourished female in no acute distress.  LUNGS: Clear to auscultation bilaterally.  HEART: Regular rate and rhythm. ABDOMEN: Soft, nontender, nondistended, gravid.  EXTREMITIES: Nontender, b/l LE edema, 2+ distal pulses. FHT:  Baseline rate 135 bpm   Variability moderate  Accelerations present   Decelerations none Contractions: none   Labs: Results for orders placed during the hospital encounter of 07/12/13 (from the past 24 hour(s))  PROTEIN / CREATININE RATIO, URINE   Collection Time    07/12/13  3:05 PM      Result Value Ref Range   Creatinine, Urine 255.07     Total Protein, Urine 22.6     PROTEIN CREATININE RATIO 0.09  0.00 - 0.15  URINALYSIS, ROUTINE W REFLEX MICROSCOPIC   Collection Time    07/12/13  3:05 PM      Result Value Ref Range   Color, Urine YELLOW  YELLOW   APPearance CLEAR  CLEAR   Specific Gravity, Urine 1.020  1.005 - 1.030   pH 6.0  5.0 - 8.0   Glucose, UA NEGATIVE  NEGATIVE mg/dL   Hgb urine dipstick NEGATIVE  NEGATIVE   Bilirubin Urine NEGATIVE  NEGATIVE   Ketones, ur NEGATIVE  NEGATIVE mg/dL   Protein, ur NEGATIVE  NEGATIVE mg/dL   Urobilinogen, UA 1.0  0.0 - 1.0 mg/dL   Nitrite NEGATIVE  NEGATIVE   Leukocytes, UA SMALL (*) NEGATIVE  URINE MICROSCOPIC-ADD ON   Collection Time    07/12/13  3:05 PM      Result Value Ref Range   Squamous Epithelial / LPF MANY (*) RARE   WBC, UA 3-6  <3 WBC/hpf   RBC / HPF 0-2  <3 RBC/hpf   Bacteria, UA MANY (*) RARE   Urine-Other MUCOUS PRESENT    CBC WITH DIFFERENTIAL   Collection Time    07/12/13  3:37 PM      Result Value Ref  Range   WBC 7.6  4.0 - 10.5 K/uL   RBC 4.61  3.87 - 5.11 MIL/uL   Hemoglobin 8.9 (*) 12.0 - 15.0 g/dL   HCT 30.3 (*) 36.0 - 46.0 %   MCV 65.7 (*) 78.0 - 100.0 fL   MCH 19.3 (*) 26.0 - 34.0 pg   MCHC 29.4 (*) 30.0 - 36.0 g/dL   RDW 30.7 (*) 11.5 - 15.5 %   Platelets 223  150 - 400 K/uL   Neutrophils Relative % 76  43 - 77 %   Neutro Abs 5.8  1.7 - 7.7 K/uL   Lymphocytes Relative 15  12 - 46 %   Lymphs Abs 1.2  0.7 - 4.0 K/uL   Monocytes Relative 8  3 - 12 %   Monocytes Absolute 0.6  0.1 - 1.0 K/uL   Eosinophils Relative 1  0 - 5 %   Eosinophils Absolute 0.1  0.0 - 0.7 K/uL   Basophils Relative 0  0 - 1 %   Basophils Absolute 0.0  0.0 - 0.1 K/uL  COMPREHENSIVE METABOLIC PANEL   Collection Time    07/12/13  3:37 PM      Result Value Ref Range   Sodium 136 (*) 137 - 147 mEq/L   Potassium 3.8  3.7 - 5.3 mEq/L   Chloride 102  96 - 112 mEq/L   CO2 22  19 - 32 mEq/L   Glucose, Bld 86  70 - 99 mg/dL   BUN 7  6 - 23 mg/dL   Creatinine, Ser 0.61   0.50 - 1.10 mg/dL   Calcium 8.4  8.4 - 10.5 mg/dL   Total Protein 6.1  6.0 - 8.3 g/dL   Albumin 2.4 (*) 3.5 - 5.2 g/dL   AST 20  0 - 37 U/L   ALT 11  0 - 35 U/L   Alkaline Phosphatase 162 (*) 39 - 117 U/L   Total Bilirubin 0.3  0.3 - 1.2 mg/dL   GFR calc non Af Amer >90  >90 mL/min   GFR calc Af Amer >90  >90 mL/min    Imaging Studies:  No results found.  Assessment: Caroline Jordan is  28 y.o. G1P0000 at [redacted]w[redacted]d presents with Hypertension .  Plan: Discharge to home.   #GHTN: has been currently managed by [facility]. Asymptomatic. Labs within normal limits.  - scheduled induction for Sunday  - return precautions provided.   #FWB: cat 1.   Rosemarie Ax 6/18/20154:58 PM  I discussed at length with patient symptoms of preeclampsia and risks vs benefits of waiting. I offered induction today, pt and partner declined with plan to return on Sunday at 39wk. Pt was asymptomatic, reassuring fetal status with normal labs. Pt did NOT meet criteria for preeclampsia and is stable for discharge with follow up on Sunday. Pt will not have additional NST between now and her induction date.  Fredrik Rigger, MD OB Fellow

## 2013-07-12 NOTE — MAU Note (Signed)
Pt sent from office for elevated b/ps. 148/92, 141/99. Pt denies HA or vision changes. Has collected 24 hr urine earlier in the week.

## 2013-07-13 ENCOUNTER — Encounter (HOSPITAL_COMMUNITY): Payer: Self-pay | Admitting: *Deleted

## 2013-07-13 ENCOUNTER — Telehealth (HOSPITAL_COMMUNITY): Payer: Self-pay | Admitting: *Deleted

## 2013-07-13 NOTE — Telephone Encounter (Signed)
Preadmission screen  

## 2013-07-15 ENCOUNTER — Inpatient Hospital Stay (HOSPITAL_COMMUNITY)
Admission: AD | Admit: 2013-07-15 | Discharge: 2013-07-18 | DRG: 774 | Disposition: A | Discharge status: Home / Self Care | Payer: BC Managed Care – PPO | Source: Ambulatory Visit | Attending: Obstetrics & Gynecology | Admitting: Obstetrics & Gynecology

## 2013-07-15 ENCOUNTER — Encounter (HOSPITAL_COMMUNITY): Payer: Self-pay

## 2013-07-15 ENCOUNTER — Encounter (HOSPITAL_COMMUNITY): Payer: BC Managed Care – PPO | Admitting: Anesthesiology

## 2013-07-15 ENCOUNTER — Inpatient Hospital Stay (HOSPITAL_COMMUNITY): Payer: BC Managed Care – PPO | Admitting: Anesthesiology

## 2013-07-15 DIAGNOSIS — O99892 Other specified diseases and conditions complicating childbirth: Secondary | ICD-10-CM | POA: Diagnosis present

## 2013-07-15 DIAGNOSIS — O9902 Anemia complicating childbirth: Secondary | ICD-10-CM | POA: Diagnosis present

## 2013-07-15 DIAGNOSIS — O1002 Pre-existing essential hypertension complicating childbirth: Principal | ICD-10-CM | POA: Diagnosis present

## 2013-07-15 DIAGNOSIS — Z6841 Body Mass Index (BMI) 40.0 and over, adult: Secondary | ICD-10-CM | POA: Diagnosis not present

## 2013-07-15 DIAGNOSIS — Z8249 Family history of ischemic heart disease and other diseases of the circulatory system: Secondary | ICD-10-CM

## 2013-07-15 DIAGNOSIS — D649 Anemia, unspecified: Secondary | ICD-10-CM | POA: Diagnosis present

## 2013-07-15 DIAGNOSIS — O9989 Other specified diseases and conditions complicating pregnancy, childbirth and the puerperium: Secondary | ICD-10-CM

## 2013-07-15 DIAGNOSIS — Z22330 Carrier of Group B streptococcus: Secondary | ICD-10-CM

## 2013-07-15 DIAGNOSIS — O99214 Obesity complicating childbirth: Secondary | ICD-10-CM

## 2013-07-15 DIAGNOSIS — E669 Obesity, unspecified: Secondary | ICD-10-CM | POA: Diagnosis present

## 2013-07-15 DIAGNOSIS — IMO0001 Reserved for inherently not codable concepts without codable children: Secondary | ICD-10-CM

## 2013-07-15 DIAGNOSIS — O10019 Pre-existing essential hypertension complicating pregnancy, unspecified trimester: Secondary | ICD-10-CM | POA: Diagnosis present

## 2013-07-15 LAB — CBC
HCT: 30.2 % — ABNORMAL LOW (ref 36.0–46.0)
HCT: 30.4 % — ABNORMAL LOW (ref 36.0–46.0)
Hemoglobin: 9 g/dL — ABNORMAL LOW (ref 12.0–15.0)
Hemoglobin: 9 g/dL — ABNORMAL LOW (ref 12.0–15.0)
MCH: 19.8 pg — ABNORMAL LOW (ref 26.0–34.0)
MCH: 19.7 pg — ABNORMAL LOW (ref 26.0–34.0)
MCHC: 29.8 g/dL — ABNORMAL LOW (ref 30.0–36.0)
MCHC: 29.6 g/dL — ABNORMAL LOW (ref 30.0–36.0)
MCV: 66.4 fL — ABNORMAL LOW (ref 78.0–100.0)
MCV: 66.5 fL — ABNORMAL LOW (ref 78.0–100.0)
Platelets: 214 10*3/uL (ref 150–400)
Platelets: 185 10*3/uL (ref 150–400)
RBC: 4.55 MIL/uL (ref 3.87–5.11)
RBC: 4.57 MIL/uL (ref 3.87–5.11)
RDW: 30.6 % — ABNORMAL HIGH (ref 11.5–15.5)
RDW: 30.8 % — ABNORMAL HIGH (ref 11.5–15.5)
WBC: 8 10*3/uL (ref 4.0–10.5)
WBC: 11 10*3/uL — ABNORMAL HIGH (ref 4.0–10.5)

## 2013-07-15 LAB — ABO/RH: ABO/RH(D): O POS

## 2013-07-15 LAB — RPR

## 2013-07-15 LAB — TYPE AND SCREEN
ABO/RH(D): O POS
Antibody Screen: NEGATIVE

## 2013-07-15 MED ORDER — OXYTOCIN 40 UNITS IN LACTATED RINGERS INFUSION - SIMPLE MED
1.0000 m[IU]/min | INTRAVENOUS | Status: DC
  Administered 2013-07-15: 2 m[IU]/min via INTRAVENOUS
  Filled 2013-07-15: qty 1000

## 2013-07-15 MED ORDER — LACTATED RINGERS IV SOLN
500.0000 mL | Freq: Once | INTRAVENOUS | Status: AC
  Administered 2013-07-15: 1000 mL via INTRAVENOUS

## 2013-07-15 MED ORDER — LACTATED RINGERS IV SOLN: 500.0000 mL | INTRAVENOUS | Status: DC | PRN

## 2013-07-15 MED ORDER — FENTANYL CITRATE 0.05 MG/ML IJ SOLN
INTRAMUSCULAR | Status: AC
  Administered 2013-07-15: 100 ug via INTRAVENOUS
  Filled 2013-07-15: qty 2

## 2013-07-15 MED ORDER — IBUPROFEN 600 MG PO TABS
600.0000 mg | ORAL_TABLET | Freq: Four times a day (QID) | ORAL | Status: DC | PRN
  Administered 2013-07-16: 600 mg via ORAL
  Filled 2013-07-15: qty 1

## 2013-07-15 MED ORDER — EPHEDRINE 5 MG/ML INJ
10.0000 mg | INTRAVENOUS | Status: DC | PRN
  Filled 2013-07-15: qty 4
  Filled 2013-07-15: qty 2

## 2013-07-15 MED ORDER — LIDOCAINE HCL (PF) 1 % IJ SOLN
30.0000 mL | INTRAMUSCULAR | Status: AC | PRN
  Administered 2013-07-15 (×2): 5 mL via SUBCUTANEOUS

## 2013-07-15 MED ORDER — LACTATED RINGERS IV SOLN
INTRAVENOUS | Status: DC
  Administered 2013-07-15 (×2): via INTRAVENOUS

## 2013-07-15 MED ORDER — FENTANYL 2.5 MCG/ML BUPIVACAINE 1/10 % EPIDURAL INFUSION (WH - ANES)
14.0000 mL/h | INTRAMUSCULAR | Status: DC | PRN
  Administered 2013-07-15: 12 mL/h via EPIDURAL
  Filled 2013-07-15: qty 125

## 2013-07-15 MED ORDER — OXYTOCIN BOLUS FROM INFUSION
500.0000 mL | INTRAVENOUS | Status: DC
  Administered 2013-07-16: 500 mL via INTRAVENOUS

## 2013-07-15 MED ORDER — ACETAMINOPHEN 325 MG PO TABS: 650.0000 mg | ORAL_TABLET | ORAL | Status: DC | PRN

## 2013-07-15 MED ORDER — TERBUTALINE SULFATE 1 MG/ML IJ SOLN: 0.2500 mg | Freq: Once | INTRAMUSCULAR | Status: AC | PRN

## 2013-07-15 MED ORDER — PENICILLIN G POTASSIUM 5000000 UNITS IJ SOLR
2.5000 10*6.[IU] | INTRAVENOUS | Status: DC
  Administered 2013-07-15 (×3): 2.5 10*6.[IU] via INTRAVENOUS
  Filled 2013-07-15 (×8): qty 2.5

## 2013-07-15 MED ORDER — PHENYLEPHRINE 40 MCG/ML (10ML) SYRINGE FOR IV PUSH (FOR BLOOD PRESSURE SUPPORT)
80.0000 ug | PREFILLED_SYRINGE | INTRAVENOUS | Status: DC | PRN
  Filled 2013-07-15: qty 2

## 2013-07-15 MED ORDER — PENICILLIN G POTASSIUM 5000000 UNITS IJ SOLR
5.0000 10*6.[IU] | Freq: Once | INTRAVENOUS | Status: AC
  Administered 2013-07-15: 5 10*6.[IU] via INTRAVENOUS
  Filled 2013-07-15: qty 5

## 2013-07-15 MED ORDER — OXYCODONE-ACETAMINOPHEN 5-325 MG PO TABS: 1.0000 | ORAL_TABLET | ORAL | Status: DC | PRN

## 2013-07-15 MED ORDER — FENTANYL CITRATE 0.05 MG/ML IJ SOLN
100.0000 ug | INTRAMUSCULAR | Status: DC | PRN
  Administered 2013-07-15 (×2): 100 ug via INTRAVENOUS
  Filled 2013-07-15 (×2): qty 2

## 2013-07-15 MED ORDER — CITRIC ACID-SODIUM CITRATE 334-500 MG/5ML PO SOLN: 30.0000 mL | ORAL | Status: DC | PRN

## 2013-07-15 MED ORDER — TERBUTALINE SULFATE 1 MG/ML IJ SOLN: 0.2500 mg | Freq: Once | INTRAMUSCULAR | Status: DC | PRN

## 2013-07-15 MED ORDER — ONDANSETRON HCL 4 MG/2ML IJ SOLN: 4.0000 mg | Freq: Four times a day (QID) | INTRAMUSCULAR | Status: DC | PRN

## 2013-07-15 MED ORDER — PHENYLEPHRINE 40 MCG/ML (10ML) SYRINGE FOR IV PUSH (FOR BLOOD PRESSURE SUPPORT)
80.0000 ug | PREFILLED_SYRINGE | INTRAVENOUS | Status: DC | PRN
  Filled 2013-07-15: qty 10
  Filled 2013-07-15: qty 2

## 2013-07-15 MED ORDER — OXYTOCIN 40 UNITS IN LACTATED RINGERS INFUSION - SIMPLE MED: 62.5000 mL/h | INTRAVENOUS | Status: DC

## 2013-07-15 MED ORDER — EPHEDRINE 5 MG/ML INJ
10.0000 mg | INTRAVENOUS | Status: DC | PRN
  Filled 2013-07-15: qty 2

## 2013-07-15 MED ORDER — DIPHENHYDRAMINE HCL 50 MG/ML IJ SOLN: 12.5000 mg | INTRAMUSCULAR | Status: DC | PRN

## 2013-07-15 NOTE — Anesthesia Procedure Notes (Signed)
Epidural Patient location during procedure: OB Start time: 07/15/2013 7:26 PM End time: 07/15/2013 7:37 PM  Staffing Anesthesiologist: MOSER, CHRIS Performed by: anesthesiologist   Preanesthetic Checklist Completed: patient identified, surgical consent, pre-op evaluation, timeout performed, IV checked, risks and benefits discussed and monitors and equipment checked  Epidural Patient position: sitting Prep: site prepped and draped and DuraPrep Patient monitoring: heart rate, cardiac monitor, continuous pulse ox and blood pressure Approach: midline Location: L4-L5 Injection technique: LOR saline  Needle:  Needle type: Tuohy  Needle gauge: 17 G Needle length: 9 cm Needle insertion depth: 8 cm Catheter type: closed end flexible Catheter size: 19 Gauge Catheter at skin depth: 15 cm Test dose: Other  Assessment Events: blood not aspirated, injection not painful, no injection resistance, negative IV test and no paresthesia  Additional Notes H+P and labs checked, risks and benefits discussed with the patient, consent obtained, procedure tolerated well and without complications.  Reason for block:procedure for pain

## 2013-07-15 NOTE — Progress Notes (Signed)
Martine Bleecker is a 28 y.o. G2P0010 at [redacted]w[redacted]d by ultrasound admitted for induction of labor due to Hypertension., rupture of membranes  Subjective:   Objective: BP 136/99  Pulse 97  Temp(Src) 97.7 F (36.5 C) (Oral)  Resp 18  Ht 4\' 11"  (1.499 m)  Wt 217 lb (98.431 kg)  BMI 43.81 kg/m2  LMP 10/15/2012      FHT:  FHR: 140 bpm, variability: moderate,  accelerations:  Present,  decelerations:  Absent UC:   regular, every 3-4 minutes SVE:   Dilation: 1 Effacement (%): 80 Station: -1 Exam by:: lawson, cnm  Labs: Lab Results  Component Value Date   WBC 8.0 07/15/2013   HGB 9.0* 07/15/2013   HCT 30.2* 07/15/2013   MCV 66.4* 07/15/2013   PLT 214 07/15/2013    Assessment / Plan: Induction of labor due to htn and SROM,  progressing well on pitocin  Labor: slow progress Preeclampsia:  no signs or symptoms of toxicity Fetal Wellbeing:  Category I Pain Control:  Labor support without medications I/D:  n/a Anticipated MOD:  NSVD  [name], [name] 07/15/2013, 2:39 PM

## 2013-07-15 NOTE — Anesthesia Preprocedure Evaluation (Signed)
Anesthesia Evaluation  Patient identified by MRN, date of birth, ID band Patient awake    Reviewed: Allergy & Precautions, H&P , NPO status , Patient's Chart, lab work & pertinent test results  History of Anesthesia Complications Negative for: history of anesthetic complications  Airway Mallampati: III TM Distance: >3 FB Neck ROM: Full    Dental  (+) Teeth Intact   Pulmonary neg pulmonary ROS,          Cardiovascular hypertension, Rhythm:Regular     Neuro/Psych negative neurological ROS  negative psych ROS   GI/Hepatic negative GI ROS, Neg liver ROS,   Endo/Other  Morbid obesity  Renal/GU negative Renal ROS     Musculoskeletal   Abdominal   Peds  Hematology  (+) anemia ,   Anesthesia Other Findings   Reproductive/Obstetrics (+) Pregnancy                           Anesthesia Physical Anesthesia Plan  ASA: II  Anesthesia Plan: Epidural   Post-op Pain Management:    Induction:   Airway Management Planned: Natural Airway  Additional Equipment: None  Intra-op Plan:   Post-operative Plan:   Informed Consent: I have reviewed the patients History and Physical, chart, labs and discussed the procedure including the risks, benefits and alternatives for the proposed anesthesia with the patient or authorized representative who has indicated his/her understanding and acceptance.   Dental advisory given  Plan Discussed with: Anesthesiologist  Anesthesia Plan Comments:         Anesthesia Quick Evaluation

## 2013-07-15 NOTE — H&P (Signed)
Caroline Jordan is a 28 y.o. female presenting for SROM and IOL for HTN. Maternal Medical History:  Reason for admission: Rupture of membranes.   Contractions: Onset was 1-2 hours ago.   Frequency: irregular.   Perceived severity is mild.    Fetal activity: Perceived fetal activity is normal.   Last perceived fetal movement was within the past hour.      OB History   Grav Para Term Preterm Abortions TAB SAB Ect Mult Living   2 0 0 0 1 1 0 0 0 0      Past Medical History  Diagnosis Date  . Anemia   . HSV infection   . Hx: UTI (urinary tract infection)   . History of trichomoniasis   . Pregnancy induced hypertension    Past Surgical History  Procedure Laterality Date  . External ear surgery      keloids   Family History: family history includes Hypertension in her father. Social History:  reports that she has never smoked. She has never used smokeless tobacco. She reports that she does not drink alcohol or use illicit drugs.   Prenatal Transfer Tool  Maternal Diabetes: No Genetic Screening: Normal Maternal Ultrasounds/Referrals: Normal Fetal Ultrasounds or other Referrals:  None Maternal Substance Abuse:  No Significant Maternal Medications:  None Significant Maternal Lab Results:  Lab values include: Group B Strep positive Other Comments:  None  Review of Systems  Constitutional: Negative.   HENT: Negative.   Eyes: Negative.   Respiratory: Negative.   Cardiovascular: Negative.   Gastrointestinal: Positive for abdominal pain.  Genitourinary: Negative.   Musculoskeletal: Negative.   Skin: Negative.   Neurological: Negative.   Endo/Heme/Allergies: Negative.   Psychiatric/Behavioral: Negative.       Blood pressure 140/94, pulse 96, resp. rate 20, height 4\' 11"  (1.499 m), weight 217 lb (98.431 kg), last menstrual period 10/15/2012. Maternal Exam:  Uterine Assessment: Contraction strength is mild.  Contraction frequency is irregular.   Abdomen: Patient  reports no abdominal tenderness. Introitus: Normal vulva. Normal vagina.    Physical Exam  Constitutional: She is oriented to person, place, and time. She appears well-developed and well-nourished.  HENT:  Head: Normocephalic.  Cardiovascular: Normal rate, regular rhythm, normal heart sounds and intact distal pulses.   Respiratory: Effort normal and breath sounds normal.  GI: Soft. Bowel sounds are normal.  Genitourinary: Vagina normal and uterus normal.  Musculoskeletal: Normal range of motion.  Neurological: She is alert and oriented to person, place, and time. She has normal reflexes.  Skin: Skin is warm and dry.  Psychiatric: She has a normal mood and affect. Her behavior is normal. Judgment and thought content normal.    Prenatal labs: ABO, Rh: O/Positive/-- (11/18 0000) Antibody: Negative (11/18 0000) Rubella: Immune (11/18 0000) RPR: Nonreactive (11/18 0000)  HBsAg: Negative (11/18 0000)  HIV: Non-reactive (11/18 0000)  GBS: Positive (06/04 0000)   Assessment/Plan: SROM at 0600 for IOL for HTN   [name], [name] 07/15/2013, 9:16 AM

## 2013-07-15 NOTE — Progress Notes (Signed)
Caroline Jordan is a 28 y.o. G2P0010 at [redacted]w[redacted]d by ultrasound admitted for SROM and HTN  Subjective:   Objective: BP 132/78  Pulse 81  Temp(Src) 98.2 F (36.8 C) (Oral)  Resp 18  Ht 4\' 11"  (1.499 m)  Wt 217 lb (98.431 kg)  BMI 43.81 kg/m2  SpO2 99%  LMP 10/15/2012      FHT:  FHR: 130's bpm, variability: moderate,  accelerations:  Present,  decelerations:  Absent UC:   regular, every 3 minutes SVE:   Dilation: 3 Effacement (%): 100 Station: 0;+1 Exam by:: Curly [place], RN  Labs: Lab Results  Component Value Date   WBC 11.0* 07/15/2013   HGB 9.0* 07/15/2013   HCT 30.4* 07/15/2013   MCV 66.5* 07/15/2013   PLT 185 07/15/2013    Assessment / Plan: SROM pit augmentation  Labor: Progressing normally Preeclampsia:  no signs or symptoms of toxicity Fetal Wellbeing:  Category I Pain Control:  Epidural I/D:  n/a Anticipated MOD:  NSVD  Caroline Jordan 07/15/2013, 7:55 PM

## 2013-07-15 NOTE — Progress Notes (Signed)
Caroline Jordan is a 28 y.o. G2P0010 at [redacted]w[redacted]d by ultrasound admitted for induction of labor due to Hypertension.  Subjective:   Objective: BP 133/77  Pulse 80  Temp(Src) 97.7 F (36.5 C) (Oral)  Resp 18  Ht 4\' 11"  (1.499 m)  Wt 217 lb (98.431 kg)  BMI 43.81 kg/m2  LMP 10/15/2012      FHT:  FHR: 140 bpm, variability: moderate,  accelerations:  Present,  decelerations:  Absent UC:   regular, every 3 minutes SVE:   Dilation: 1 Effacement (%): 80 Station: -2 Exam by:: Caroline Jordan, CNM  Labs: Lab Results  Component Value Date   WBC 8.0 07/15/2013   HGB 9.0* 07/15/2013   HCT 30.2* 07/15/2013   MCV 66.4* 07/15/2013   PLT 214 07/15/2013    Assessment / Plan: Induction of labor due to htn,  progressing well on pitocin  Labor: Progressing normally Preeclampsia:  no signs or symptoms of toxicity Fetal Wellbeing:  Category I Pain Control:  Labor support without medications I/D:  n/a Anticipated MOD:  NSVD  Caroline Jordan 07/15/2013, 10:59 AM

## 2013-07-16 ENCOUNTER — Encounter (HOSPITAL_COMMUNITY): Payer: Self-pay

## 2013-07-16 DIAGNOSIS — Z6841 Body Mass Index (BMI) 40.0 and over, adult: Secondary | ICD-10-CM

## 2013-07-16 DIAGNOSIS — O10019 Pre-existing essential hypertension complicating pregnancy, unspecified trimester: Secondary | ICD-10-CM | POA: Diagnosis not present

## 2013-07-16 DIAGNOSIS — O99892 Other specified diseases and conditions complicating childbirth: Secondary | ICD-10-CM

## 2013-07-16 DIAGNOSIS — O9989 Other specified diseases and conditions complicating pregnancy, childbirth and the puerperium: Secondary | ICD-10-CM

## 2013-07-16 DIAGNOSIS — O1002 Pre-existing essential hypertension complicating childbirth: Secondary | ICD-10-CM | POA: Diagnosis not present

## 2013-07-16 MED ORDER — LIDOCAINE HCL (PF) 1 % IJ SOLN
INTRAMUSCULAR | Status: AC
  Filled 2013-07-16: qty 30

## 2013-07-16 MED ORDER — IBUPROFEN 600 MG PO TABS
600.0000 mg | ORAL_TABLET | Freq: Four times a day (QID) | ORAL | Status: DC
  Administered 2013-07-16 – 2013-07-18 (×8): 600 mg via ORAL
  Filled 2013-07-16 (×8): qty 1

## 2013-07-16 MED ORDER — BENZOCAINE-MENTHOL 20-0.5 % EX AERO
1.0000 "application " | INHALATION_SPRAY | CUTANEOUS | Status: DC | PRN
  Administered 2013-07-16: 1 via TOPICAL
  Filled 2013-07-16: qty 56

## 2013-07-16 MED ORDER — TETANUS-DIPHTH-ACELL PERTUSSIS 5-2.5-18.5 LF-MCG/0.5 IM SUSP: 0.5000 mL | Freq: Once | INTRAMUSCULAR | Status: DC

## 2013-07-16 MED ORDER — ZOLPIDEM TARTRATE 5 MG PO TABS: 5.0000 mg | ORAL_TABLET | Freq: Every evening | ORAL | Status: DC | PRN

## 2013-07-16 MED ORDER — SIMETHICONE 80 MG PO CHEW: 80.0000 mg | CHEWABLE_TABLET | ORAL | Status: DC | PRN

## 2013-07-16 MED ORDER — DIBUCAINE 1 % RE OINT: 1.0000 "application " | TOPICAL_OINTMENT | RECTAL | Status: DC | PRN

## 2013-07-16 MED ORDER — PRENATAL MULTIVITAMIN CH
1.0000 | ORAL_TABLET | Freq: Every day | ORAL | Status: DC
  Administered 2013-07-17: 1 via ORAL
  Filled 2013-07-16: qty 1

## 2013-07-16 MED ORDER — ONDANSETRON HCL 4 MG/2ML IJ SOLN: 4.0000 mg | INTRAMUSCULAR | Status: DC | PRN

## 2013-07-16 MED ORDER — OXYCODONE-ACETAMINOPHEN 5-325 MG PO TABS: 1.0000 | ORAL_TABLET | ORAL | Status: DC | PRN

## 2013-07-16 MED ORDER — ONDANSETRON HCL 4 MG PO TABS: 4.0000 mg | ORAL_TABLET | ORAL | Status: DC | PRN

## 2013-07-16 MED ORDER — SENNOSIDES-DOCUSATE SODIUM 8.6-50 MG PO TABS
2.0000 | ORAL_TABLET | ORAL | Status: DC
  Administered 2013-07-16: 2 via ORAL
  Administered 2013-07-17: 1 via ORAL
  Filled 2013-07-16: qty 2
  Filled 2013-07-16: qty 1

## 2013-07-16 MED ORDER — LANOLIN HYDROUS EX OINT: TOPICAL_OINTMENT | CUTANEOUS | Status: DC | PRN

## 2013-07-16 MED ORDER — DIPHENHYDRAMINE HCL 25 MG PO CAPS: 25.0000 mg | ORAL_CAPSULE | Freq: Four times a day (QID) | ORAL | Status: DC | PRN

## 2013-07-16 MED ORDER — WITCH HAZEL-GLYCERIN EX PADS: 1.0000 "application " | MEDICATED_PAD | CUTANEOUS | Status: DC | PRN

## 2013-07-16 NOTE — Lactation Note (Signed)
This note was copied from the chart of Girl [name]. Lactation Consultation Note Initial consultation; baby 39 hours old. Mom holding baby STS. Offered to assist with a feeding, mom accepts. Baby very sleepy and did not wake despite waking techniques used. Pediatrician then came in for baby's exam, and baby then woke enough to latch well for 6 minutes, then sound asleep again.  Discussed br feeding basics including hand expression, reviewed baby and me book re: breastfeeding, answered questions. Hand pump provided at mom's request, and demonstrated how to use it. Enc mom to delay pumping and bottle feeding unless medically necessary, for a week or so. Reviewed lactation brochure, community resources, BFSG.  Enc mom to call if she has any concerns.  Patient Name: Girl Briarrose Shor Today's Date: 07/16/2013 Reason for consult: Initial assessment   Maternal Data Formula Feeding for Exclusion: No Has patient been taught Hand Expression?: Yes Does the patient have breastfeeding experience prior to this delivery?: No  Feeding Feeding Type: Breast Fed Length of feed: 6 min  LATCH Score/Interventions Latch: Grasps breast easily, tongue down, lips flanged, rhythmical sucking.  Audible Swallowing: A few with stimulation Intervention(s): Hand expression;Skin to skin  Type of Nipple: Everted at rest and after stimulation  Comfort (Breast/Nipple): Soft / non-tender     Hold (Positioning): Assistance needed to correctly position infant at breast and maintain latch.  LATCH Score: 8  Lactation Tools Discussed/Used     Consult Status Consult Status: Follow-up Follow-up type: In-patient    [facility] 07/16/2013, 10:55 AM

## 2013-07-16 NOTE — Anesthesia Postprocedure Evaluation (Signed)
Anesthesia Post Note  Patient: Caroline Jordan  Procedure(s) Performed: * No procedures listed *  Anesthesia type: Epidural  Patient location: Mother/Baby  Post pain: Pain level controlled  Post assessment: Post-op Vital signs reviewed  Last Vitals:  Filed Vitals:   07/16/13 0950  BP: 133/75  Pulse: 77  Temp: 36.3 C  Resp: 20    Post vital signs: Reviewed  Level of consciousness:alert  Complications: No apparent anesthesia complications

## 2013-07-17 MED ORDER — IBUPROFEN 600 MG PO TABS: 600.0000 mg | ORAL_TABLET | Freq: Four times a day (QID) | ORAL | Status: DC

## 2013-07-17 NOTE — Discharge Instructions (Signed)

## 2013-07-17 NOTE — Discharge Summary (Signed)
Obstetric Discharge Summary Reason for Admission: rupture of membranes and induction of labor for gestational hypertension.   Prenatal Procedures: NST and ultrasound Intrapartum Procedures: spontaneous vaginal delivery and GBS prophylaxis Postpartum Procedures: none Complications-Operative and Postpartum: 1st degree perineal laceration Hemoglobin  Date Value Ref Range Status  07/15/2013 9.0* 12.0 - 15.0 g/dL Final     HCT  Date Value Ref Range Status  07/15/2013 30.4* 36.0 - 46.0 % Final   S:  Pt reports doing well, desires discharge home if infant is able to go.  Uncertain regarding family planning, will decide by 6 week postpartum visit.  Breastfeeding well.  Physical Exam:  Filed Vitals:   07/17/13 0535  BP: 126/83  Pulse: 89  Temp: 97.8 F (36.6 C)  Resp: 18  General: alert, cooperative and appears stated age Lochia: appropriate Uterine Fundus: firm Incision: n/a DVT Evaluation: No evidence of DVT seen on physical exam. Negative Homan's sign.   Discharge Diagnoses: Term Pregnancy-delivered and Gestational Hypertension  Discharge Information: Date: 07/17/2013 Activity: pelvic rest Diet: routine Medications: Ibuprofen and Iron Condition: stable Instructions: refer to practice specific booklet Discharge to: home Follow-up Information   Follow up with [facility] In 6 weeks.   Contact information:   [place] [address] [phone]      Newborn Data: Live born female  Birth Weight: 6 lb 8.9 oz (2975 g) APGAR: 8, 9  Home with mother.  [facility] 07/17/2013, 7:42 AM

## 2013-07-18 ENCOUNTER — Encounter (HOSPITAL_COMMUNITY): Payer: Self-pay

## 2013-07-18 NOTE — Discharge Summary (Signed)
Attestation of Attending Supervision of Advanced Practitioner (CNM/NP): Evaluation and management procedures were performed by the Advanced Practitioner under my supervision and collaboration.  I have reviewed the Advanced Practitioner's note and chart, and I agree with the management and plan.  CONSTANT,PEGGY 07/18/2013 11:02 AM

## 2013-07-18 NOTE — Discharge Summary (Signed)
Obstetric Discharge Summary Reason for Admission: rupture of membranes, IOL for HTN Prenatal Procedures: ultrasound Intrapartum Procedures: spontaneous vaginal delivery Postpartum Procedures: none Complications-Operative and Postpartum: 1st degree perineal laceration Hemoglobin  Date Value Ref Range Status  07/15/2013 9.0* 12.0 - 15.0 g/dL Final     HCT  Date Value Ref Range Status  07/15/2013 30.4* 36.0 - 46.0 % Final    Physical Exam:  General: alert, cooperative and no distress Lochia: appropriate Uterine Fundus: firm Incision: n/a DVT Evaluation: No evidence of DVT seen on physical exam. Negative Homan's sign.   Hospital Course:  Patient presented to [facility] 07/15/13 with SROM, IOL for HTN. Ppx for GBS. Labor progressed on pitocin, epidural placed, SVD 07/16/13 see note below. Patient meeting postpartum milestones: tol diet/ambulation/void, breastfeeding going well though she is thinking of stopping this, pain is well controlled. Plans OCP/POP for contraception, pelvic rest until postpartum visit.   Delivery Note  Called to patient room for delivery. Patient pushed over intact perineum. At 1:09 AM a viable female was delivered via Vaginal, Spontaneous Delivery (Presentation: ; Occiput Anterior). APGAR: 8, 9; weight pending. Cord clamped x 2 and cut. Active management 3rd stage with traction, Placenta status: Intact, Spontaneous. Cord: 3 vessel with the following complications: None. 1st degree perineal/sulcal tear repaired in usual fashion as noted below, hemostatic. Cord pH: n/a. Counts correct. Hemostatic. CNM present for delivery.  Anesthesia: Epidural  Episiotomy: None  Lacerations: 1st degree;Perineal  Suture Repair: 2.0 vicryl,  Est. Blood Loss (mL): 200  Mom to postpartum. Baby to Couplet care / Skin to Skin.  Emeterio Reeve  07/16/2013, 1:31 AM  Discharge Diagnoses: Term Pregnancy-delivered  Discharge Information: Date: 07/18/2013 Activity: pelvic rest Diet:  routine Medications: PNV and Ibuprofen Condition: stable Instructions: refer to practice specific booklet Discharge to: home Follow-up Information   Follow up with [facility] In 6 weeks.   Contact information:   [employer] [place] [address] [phone]      Schedule an appointment as soon as possible for a visit with [facility] [place]. (6 weeks postpartum visit)    Contact information:   [place] [phone]      Newborn Data: Live born female  Birth Weight: 6 lb 8.9 oz (2975 g) APGAR: 8, 9  Home with mother.  Emeterio Reeve 07/18/2013, 7:27 AM  I have seen and examined this patient and agree with above documentation in the resident's note.   Ebbie Latus, M.D. [facility] Fellow 07/18/2013 9:34 AM

## 2013-07-19 NOTE — Discharge Summary (Signed)
Attestation of Attending Supervision of Advanced Practitioner (CNM/NP): Evaluation and management procedures were performed by the Advanced Practitioner under my supervision and collaboration. I have reviewed the Advanced Practitioner's note and chart, and I agree with the management and plan.  [name],[name] 1:39 PM

## 2013-11-26 ENCOUNTER — Encounter (HOSPITAL_COMMUNITY): Payer: Self-pay

## 2015-10-08 ENCOUNTER — Encounter (HOSPITAL_COMMUNITY): Payer: Self-pay

## 2015-10-08 ENCOUNTER — Emergency Department (HOSPITAL_COMMUNITY)
Admission: EM | Admit: 2015-10-08 | Discharge: 2015-10-08 | Disposition: A | Discharge status: Home / Self Care | Payer: Medicaid Other | Attending: Emergency Medicine | Admitting: Emergency Medicine

## 2015-10-08 DIAGNOSIS — L299 Pruritus, unspecified: Secondary | ICD-10-CM | POA: Insufficient documentation

## 2015-10-08 DIAGNOSIS — Z79899 Other long term (current) drug therapy: Secondary | ICD-10-CM | POA: Insufficient documentation

## 2015-10-08 DIAGNOSIS — R21 Rash and other nonspecific skin eruption: Secondary | ICD-10-CM | POA: Insufficient documentation

## 2015-10-08 MED ORDER — TRIAMCINOLONE ACETONIDE 0.1 % EX CREA: 1.0000 "application " | TOPICAL_CREAM | Freq: Two times a day (BID) | CUTANEOUS | 0 refills | Status: DC

## 2015-10-08 NOTE — ED Provider Notes (Signed)
[place] DEPT Provider Note   CSN: [id number] Arrival date & time: 10/08/15  0741     History   Chief Complaint Chief Complaint  Patient presents with  . Rash    HPI Caroline Jordan is a 30 y.o. female.  HPI   Caroline Jordan is a 30 y.o. female, with a history of Anemia, presenting to the ED with A highly pruritic rash beginning 3 days ago on her anterior right forearm. Patient denies knowledge of any new contacts with soaps, lotions, or similar products. Denies known contact with vegetation outdoors.  Patient's 38-year-old daughter began having a similar appearing rash this morning. However, her rashes widespread across the daughter's body. Denies fevers/chills, nausea/vomiting, cough, recent illness, or any other complaints.   Past Medical History:  Diagnosis Date  . Anemia   . History of trichomoniasis   . HSV infection   . Hx: UTI (urinary tract infection)   . Pregnancy induced hypertension     Patient Active Problem List   Diagnosis Date Noted  . Active labor 07/15/2013  . Labor without complication [id number]    Past Surgical History:  Procedure Laterality Date  . EXTERNAL EAR SURGERY     keloids    OB History    Gravida Para Term Preterm AB Living   2 1 1  0 1 1   SAB TAB Ectopic Multiple Live Births   0 1 0 0 1       Home Medications    Prior to Admission medications   Medication Sig Start Date End Date Taking? Authorizing Provider  valACYclovir (VALTREX) 500 MG tablet Take 500 mg by mouth 2 (two) times daily as needed (For outbreaks.).    Yes Historical Provider, MD  triamcinolone cream (KENALOG) 0.1 % Apply 1 application topically 2 (two) times daily. 10/08/15   Lorayne Bender, PA-C    Family History Family History  Problem Relation Age of Onset  . Hypertension Father     Social History Social History  Substance Use Topics  . Smoking status: Never Smoker  . Smokeless tobacco: Never Used  . Alcohol use No     Comment: social      Allergies   Review of patient's allergies indicates no known allergies.   Review of Systems Review of Systems  Constitutional: Negative for chills and fever.  Respiratory: Negative for cough and shortness of breath.   Gastrointestinal: Negative for abdominal pain, nausea and vomiting.  Musculoskeletal: Negative for myalgias and neck pain.  Skin: Positive for rash.  All other systems reviewed and are negative.    Physical Exam Updated Vital Signs BP 124/76 (BP Location: Left Arm)   Pulse 98   Temp 98.5 F (36.9 C) (Oral)   Resp 18   LMP 10/06/2015 (Exact Date)   SpO2 100%   Physical Exam  Constitutional: She appears well-developed and well-nourished. No distress.  HENT:  Head: Normocephalic and atraumatic.  Eyes: Conjunctivae are normal.  Neck: Neck supple.  Cardiovascular: Normal rate, regular rhythm and intact distal pulses.   Pulmonary/Chest: Effort normal. No respiratory distress.  Abdominal: There is no guarding.  Musculoskeletal: She exhibits no edema or tenderness.  Lymphadenopathy:    She has no cervical adenopathy.  Neurological: She is alert.  Skin: Skin is warm and dry. She is not diaphoretic.  And very small, erythematous, maculopapular rash distributed across the right anterior forearm. No associated tenderness. No pustules. No axillary lymphadenopathy.  Psychiatric: She has a normal mood and affect. Her behavior is  normal.  Nursing note and vitals reviewed.    ED Treatments / Results  Labs (all labs ordered are listed, but only abnormal results are displayed) Labs Reviewed - No data to display  EKG  EKG Interpretation None       Radiology No results found.  Procedures Procedures (including critical care time)  Medications Ordered in ED Medications - No data to display   Initial Impression / Assessment and Plan / ED Course  I have reviewed the triage vital signs and the nursing notes.  Pertinent labs & imaging results that were  available during my care of the patient were reviewed by me and considered in my medical decision making (see chart for details).  Clinical Course   Patient presents with a fine, pruritic, erythematous rash on her arm for the last 3 days.  This may be a contact dermatitis or a viral exanthem. Patient has no systemic symptoms. Topical therapy is indicated. Patient to follow-up with her PCP for reevaluation. Return precautions discussed.    Final Clinical Impressions(s) / ED Diagnoses   Final diagnoses:  Rash    New Prescriptions New Prescriptions   TRIAMCINOLONE CREAM (KENALOG) 0.1 %    Apply 1 application topically 2 (two) times daily.     Lorayne Bender, PA-C 10/08/15 0914    Gwenyth Allegra Tegeler, MD 10/08/15 1108

## 2015-10-08 NOTE — ED Triage Notes (Signed)
She c/o mildly pruritic papular rash x 2 days, otherwise healthy.

## 2015-10-08 NOTE — Discharge Instructions (Signed)
You have been seen today for a rash. This may be due to something you came in contact with or it may be due to the beginnings of a viral illness. Use the triamcinolone cream on the affected areas twice a day as needed. Follow-up with your PCP as soon as possible should symptoms continue.

## 2016-10-21 ENCOUNTER — Emergency Department (HOSPITAL_COMMUNITY)
Admission: EM | Admit: 2016-10-21 | Discharge: 2016-10-21 | Disposition: A | Discharge status: Home / Self Care | Payer: No Typology Code available for payment source | Attending: Emergency Medicine | Admitting: Emergency Medicine

## 2016-10-21 ENCOUNTER — Encounter (HOSPITAL_COMMUNITY): Payer: Self-pay | Admitting: Emergency Medicine

## 2016-10-21 DIAGNOSIS — M542 Cervicalgia: Secondary | ICD-10-CM | POA: Diagnosis not present

## 2016-10-21 DIAGNOSIS — M791 Myalgia: Secondary | ICD-10-CM | POA: Insufficient documentation

## 2016-10-21 MED ORDER — CYCLOBENZAPRINE HCL 10 MG PO TABS: 10.0000 mg | ORAL_TABLET | Freq: Two times a day (BID) | ORAL | 0 refills | Status: DC | PRN

## 2016-10-21 MED ORDER — IBUPROFEN 600 MG PO TABS: 600.0000 mg | ORAL_TABLET | Freq: Four times a day (QID) | ORAL | 0 refills | Status: DC | PRN
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
## 2018-07-15 ENCOUNTER — Other Ambulatory Visit: Payer: Self-pay

## 2018-07-15 DIAGNOSIS — R079 Chest pain, unspecified: Secondary | ICD-10-CM | POA: Insufficient documentation

## 2018-07-15 DIAGNOSIS — Z79899 Other long term (current) drug therapy: Secondary | ICD-10-CM | POA: Diagnosis not present

## 2018-07-15 DIAGNOSIS — R002 Palpitations: Secondary | ICD-10-CM | POA: Insufficient documentation

## 2018-07-15 NOTE — ED Triage Notes (Signed)
Pt reports having chest pain today on left chest and nose bleed x 2. Pt anxious at time of triage.

## 2018-07-16 ENCOUNTER — Encounter (HOSPITAL_COMMUNITY): Payer: Self-pay | Admitting: Emergency Medicine

## 2018-07-16 ENCOUNTER — Other Ambulatory Visit: Payer: Self-pay

## 2018-07-16 ENCOUNTER — Emergency Department (HOSPITAL_COMMUNITY)
Admission: EM | Admit: 2018-07-16 | Discharge: 2018-07-16 | Disposition: A | Discharge status: Home / Self Care | Payer: 59 | Attending: Emergency Medicine | Admitting: Emergency Medicine

## 2018-07-16 ENCOUNTER — Emergency Department (HOSPITAL_COMMUNITY): Payer: 59

## 2018-07-16 DIAGNOSIS — R002 Palpitations: Secondary | ICD-10-CM

## 2018-07-16 DIAGNOSIS — R079 Chest pain, unspecified: Secondary | ICD-10-CM

## 2018-07-16 LAB — CBC
HCT: 32.5 % — ABNORMAL LOW (ref 36.0–46.0)
Hemoglobin: 9.4 g/dL — ABNORMAL LOW (ref 12.0–15.0)
MCH: 18.8 pg — ABNORMAL LOW (ref 26.0–34.0)
MCHC: 28.9 g/dL — ABNORMAL LOW (ref 30.0–36.0)
MCV: 65.1 fL — ABNORMAL LOW (ref 80.0–100.0)
Platelets: 351 10*3/uL (ref 150–400)
RBC: 4.99 MIL/uL (ref 3.87–5.11)
RDW: 20.5 % — ABNORMAL HIGH (ref 11.5–15.5)
WBC: 6.6 10*3/uL (ref 4.0–10.5)
nRBC: 0 % (ref 0.0–0.2)

## 2018-07-16 LAB — BASIC METABOLIC PANEL
Anion gap: 11 (ref 5–15)
BUN: 12 mg/dL (ref 6–20)
CO2: 20 mmol/L — ABNORMAL LOW (ref 22–32)
Calcium: 8.6 mg/dL — ABNORMAL LOW (ref 8.9–10.3)
Chloride: 107 mmol/L (ref 98–111)
Creatinine, Ser: 0.74 mg/dL (ref 0.44–1.00)
GFR calc Af Amer: >60 mL/min (ref >60)
GFR calc non Af Amer: >60 mL/min (ref >60)
Glucose, Bld: 85 mg/dL (ref 70–99)
Potassium: 3.4 mmol/L — ABNORMAL LOW (ref 3.5–5.1)
Sodium: 138 mmol/L (ref 135–145)

## 2018-07-16 LAB — I-STAT BETA HCG BLOOD, ED (MC, WL, AP ONLY): I-stat hCG, quantitative: <5 m[IU]/mL (ref <5)

## 2018-07-16 LAB — TROPONIN I: Troponin I: <0.03 ng/mL (ref <0.03)

## 2018-07-16 LAB — D-DIMER, QUANTITATIVE (NOT AT ARMC): D-Dimer, Quant: 0.36 ug/mL-FEU (ref 0.00–0.50)

## 2018-07-16 MED ORDER — METOPROLOL TARTRATE 25 MG PO TABS: 25.0000 mg | ORAL_TABLET | Freq: Two times a day (BID) | ORAL | 2 refills | Status: DC

## 2018-07-16 MED ORDER — SODIUM CHLORIDE 0.9% FLUSH: 3.0000 mL | Freq: Once | INTRAVENOUS | Status: DC

## 2018-07-16 MED ORDER — METOPROLOL TARTRATE 25 MG PO TABS
25.0000 mg | ORAL_TABLET | Freq: Once | ORAL | Status: AC
  Administered 2018-07-16: 03:00:00 25 mg via ORAL
  Filled 2018-07-16: qty 1

## 2018-07-16 MED ORDER — SODIUM CHLORIDE (PF) 0.9 % IJ SOLN
INTRAMUSCULAR | Status: AC
  Filled 2018-07-16: qty 50

## 2018-07-16 MED ORDER — SODIUM CHLORIDE 0.9 % IV BOLUS
1000.0000 mL | Freq: Once | INTRAVENOUS | Status: AC
  Administered 2018-07-16: 1000 mL via INTRAVENOUS

## 2018-07-16 MED ORDER — IOHEXOL 350 MG/ML SOLN
100.0000 mL | Freq: Once | INTRAVENOUS | Status: AC | PRN
  Administered 2018-07-16: 100 mL via INTRAVENOUS

## 2018-07-16 NOTE — ED Notes (Signed)
Pt transported to xray 

## 2018-07-16 NOTE — ED Provider Notes (Signed)
[facility] DEPT Provider Note   CSN: [id number] Arrival date & time: 07/15/18  2350    History   Chief Complaint Chief Complaint  Patient presents with   Chest Pain    HPI Caroline Jordan is a 33 y.o. female.     Patient presents to the emergency department for evaluation of chest discomfort.  Patient experiencing discomfort on the left side of her chest tonight.  She feels slightly short of breath and pain is affected by breathing.  She has also discovered a "knot" in her left calf.  She does not have a history of blood clots.  She denies estrogen use.  No recent surgery or travel.     Past Medical History:  Diagnosis Date   Anemia    History of trichomoniasis    HSV infection    Hx: UTI (urinary tract infection)    Pregnancy induced hypertension     Patient Active Problem List   Diagnosis Date Noted   Active labor 07/15/2013   Labor without complication 35/46/5681    Past Surgical History:  Procedure Laterality Date   EXTERNAL EAR SURGERY     keloids     OB History    Gravida  2   Para  1   Term  1   Preterm  0   AB  1   Living  1     SAB  0   TAB  1   Ectopic  0   Multiple  0   Live Births  1            Home Medications    Prior to Admission medications   Medication Sig Start Date End Date Taking? Authorizing Provider  fexofenadine (ALLEGRA) 180 MG tablet Take 180 mg by mouth daily as needed for allergies or rhinitis.   Yes [provider]  valACYclovir (VALTREX) 500 MG tablet Take 500 mg by mouth 2 (two) times daily as needed (For outbreaks.).    Yes [provider]  metoprolol tartrate (LOPRESSOR) 25 MG tablet Take 1 tablet (25 mg total) by mouth 2 (two) times daily. 07/16/18   Orpah Greek, MD  triamcinolone cream (KENALOG) 0.1 % Apply 1 application topically 2 (two) times daily. Patient not taking: Reported on 07/16/2018 10/08/15   Lorayne Bender, PA-C     Family History Family History  Problem Relation Age of Onset   Hypertension Father     Social History Social History   Tobacco Use   Smoking status: Never Smoker   Smokeless tobacco: Never Used  Substance Use Topics   Alcohol use: No    Comment: social   Drug use: No     Allergies   Patient has no known allergies.   Review of Systems Review of Systems  Respiratory: Positive for chest tightness and shortness of breath.   All other systems reviewed and are negative.    Physical Exam Updated Vital Signs BP (!) 157/92    Pulse 78    Temp 98.9 F (37.2 C) (Oral)    Resp 17    Ht 4\' 11"  (1.499 m)    Wt 90.7 kg    LMP 07/13/2018    SpO2 100%    BMI 40.40 kg/m   Physical Exam Vitals signs and nursing note reviewed.  Constitutional:      General: She is not in acute distress.    Appearance: Normal appearance. She is well-developed.  HENT:     Head:  Normocephalic and atraumatic.     Right Ear: Hearing normal.     Left Ear: Hearing normal.     Nose: Nose normal.  Eyes:     Conjunctiva/sclera: Conjunctivae normal.     Pupils: Pupils are equal, round, and reactive to light.  Neck:     Musculoskeletal: Normal range of motion and neck supple.  Cardiovascular:     Rate and Rhythm: Regular rhythm.     Heart sounds: S1 normal and S2 normal. No murmur. No friction rub. No gallop.   Pulmonary:     Effort: Pulmonary effort is normal. No respiratory distress.     Breath sounds: Normal breath sounds.  Chest:     Chest wall: No tenderness.  Abdominal:     General: Bowel sounds are normal.     Palpations: Abdomen is soft.     Tenderness: There is no abdominal tenderness. There is no guarding or rebound. Negative signs include Murphy's sign and McBurney's sign.     Hernia: No hernia is present.  Musculoskeletal: Normal range of motion.     Left lower leg: She exhibits tenderness (Tender nodule upper medial aspect of calf, no overlying erythema, warmth, induration, no  fluctuance).  Skin:    General: Skin is warm and dry.     Findings: No rash.  Neurological:     Mental Status: She is alert and oriented to person, place, and time.     GCS: GCS eye subscore is 4. GCS verbal subscore is 5. GCS motor subscore is 6.     Cranial Nerves: No cranial nerve deficit.     Sensory: No sensory deficit.     Coordination: Coordination normal.  Psychiatric:        Speech: Speech normal.        Behavior: Behavior normal.        Thought Content: Thought content normal.      ED Treatments / Results  Labs (all labs ordered are listed, but only abnormal results are displayed) Labs Reviewed  BASIC METABOLIC PANEL - Abnormal; Notable for the following components:      Result Value   Potassium 3.4 (*)    CO2 20 (*)    Calcium 8.6 (*)    All other components within normal limits  CBC - Abnormal; Notable for the following components:   Hemoglobin 9.4 (*)    HCT 32.5 (*)    MCV 65.1 (*)    MCH 18.8 (*)    MCHC 28.9 (*)    RDW 20.5 (*)    All other components within normal limits  TROPONIN I  D-DIMER, QUANTITATIVE (NOT AT [facility])  I-STAT BETA HCG BLOOD, ED ([facility], [facility], [facility] ONLY)    EKG EKG Interpretation  Date/Time:  Saturday July 15 2018 23:57:52 EDT Ventricular Rate:  104 PR Interval:    QRS Duration: 92 QT Interval:  355 QTC Calculation: 467 R Axis:   73 Text Interpretation:  Sinus tachycardia Baseline wander in lead(s) I II aVR aVF No previous tracing Confirmed by Orpah Greek [phone]) on 07/16/2018 12:01:24 AM   Radiology Dg Chest 2 View  Result Date: 07/16/2018 CLINICAL DATA:  33 y/o  F; left-sided chest pain and nose bleed. EXAM: CHEST - 2 VIEW COMPARISON:  None. FINDINGS: The heart size and mediastinal contours are within normal limits. Both lungs are clear. The visualized skeletal structures are unremarkable. IMPRESSION: No active cardiopulmonary disease. Electronically Signed   By: Kristine Garbe M.D.   On: 07/16/2018 00:46  Ct  Angio Chest Pe W Or Wo Contrast  Result Date: 07/16/2018 CLINICAL DATA:  PE suspected, intermediate prob, neg D-dimer. Left chest pain. EXAM: CT ANGIOGRAPHY CHEST WITH CONTRAST TECHNIQUE: Multidetector CT imaging of the chest was performed using the standard protocol during bolus administration of intravenous contrast. Multiplanar CT image reconstructions and MIPs were obtained to evaluate the vascular anatomy. CONTRAST:  143mL OMNIPAQUE IOHEXOL 350 MG/ML SOLN COMPARISON:  Chest radiograph earlier this day. FINDINGS: Cardiovascular: There are no filling defects within the pulmonary arteries to suggest pulmonary embolus. Subsegmental branches are not well assessed due to contrast bolus timing and soft tissue attenuation from habitus. Heart is normal in size. No pericardial effusion. No aortic dissection. Mediastinum/Nodes: No enlarged mediastinal or hilar lymph nodes. Esophagus is decompressed. No thyroid nodule. Lungs/Pleura: The lungs are clear. No consolidation. No pulmonary edema. No pleural fluid. Upper Abdomen: No acute abnormality. Musculoskeletal: There are no acute or suspicious osseous abnormalities. Review of the MIP images confirms the above findings. IMPRESSION: No pulmonary embolus or acute intrathoracic abnormality. Electronically Signed   By: Keith Rake M.D.   On: 07/16/2018 03:32    Procedures Procedures (including critical care time)  Medications Ordered in ED Medications  sodium chloride flush (NS) 0.9 % injection 3 mL (has no administration in time range)  sodium chloride (PF) 0.9 % injection (has no administration in time range)  sodium chloride 0.9 % bolus 1,000 mL (1,000 mLs Intravenous New Bag/Given 07/16/18 0251)  metoprolol tartrate (LOPRESSOR) tablet 25 mg (25 mg Oral Given 07/16/18 0250)  iohexol (OMNIPAQUE) 350 MG/ML injection 100 mL (100 mLs Intravenous Contrast Given 07/16/18 0310)     Initial Impression / Assessment and Plan / ED Course  I have reviewed the triage  vital signs and the nursing notes.  Pertinent labs & imaging results that were available during my care of the patient were reviewed by me and considered in my medical decision making (see chart for details).        Patient presents to the emergency department for evaluation of chest pain.  Patient reports that she has been having left-sided chest pain with mild shortness of breath.  She does not have any significant cardiac risk factors.  Likewise she does not have any PE risk factors.  She did, however, have a tender knot on the left calf region.  This does not appear to be an abscess or cellulitis.  It did not feel clearly like a venous cord or part of her vasculature either.  She does not have associated unilateral leg swelling and d-dimer is negative.  Doubt DVT.  Her work-up has been negative.  This includes EKG, troponin, d-dimer.  Additionally, she underwent CT angiography to further evaluate her chest pain.  All of this has been negative.  Patient is extremely anxious here in the ER.  She is fixating on her blood pressure and becomes very concerned when the blood pressure is slightly elevated.  She has had some resting tachycardia here but this also seems to be related to her anxiety.  She did, however, have an event where her heart rate went to 171, narrow complex regular tachycardia.       This was brief and self-limited, did not recur.  She was treated with Lopressor here in the ER and will need outpatient follow-up with cardiology.  We will continue Lopressor as an outpatient.  Final Clinical Impressions(s) / ED Diagnoses   Final diagnoses:  Chest pain, unspecified type  Palpitations  ED Discharge Orders         Ordered    metoprolol tartrate (LOPRESSOR) 25 MG tablet  2 times daily     07/16/18 0348           Orpah Greek, MD 07/16/18 [phone]

## 2018-09-07 ENCOUNTER — Ambulatory Visit: Payer: 59 | Admitting: Podiatry

## 2018-09-21 ENCOUNTER — Other Ambulatory Visit: Payer: Self-pay

## 2018-09-21 ENCOUNTER — Ambulatory Visit (INDEPENDENT_AMBULATORY_CARE_PROVIDER_SITE_OTHER): Payer: 59 | Admitting: Podiatry

## 2018-09-21 ENCOUNTER — Encounter: Payer: Self-pay | Admitting: Podiatry

## 2018-09-21 ENCOUNTER — Ambulatory Visit (INDEPENDENT_AMBULATORY_CARE_PROVIDER_SITE_OTHER): Payer: 59

## 2018-09-21 VITALS — BP 124/86

## 2018-09-21 DIAGNOSIS — M2011 Hallux valgus (acquired), right foot: Secondary | ICD-10-CM | POA: Diagnosis not present

## 2018-09-21 DIAGNOSIS — M2012 Hallux valgus (acquired), left foot: Secondary | ICD-10-CM | POA: Diagnosis not present

## 2018-09-21 NOTE — Patient Instructions (Addendum)
Bunion  A bunion is a bump on the base of the big toe that forms when the bones of the big toe joint move out of position. Bunions may be small at first, but they often get larger over time. They can make walking painful. What are the causes? A bunion may be caused by:  Wearing narrow or pointed shoes that force the big toe to press against the other toes.  Abnormal foot development that causes the foot to roll inward (pronate).  Changes in the foot that are caused by certain diseases, such as rheumatoid arthritis or polio.  A foot injury. What increases the risk? The following factors may make you more likely to develop this condition:  Wearing shoes that squeeze the toes together.  Having certain diseases, such as: ? Rheumatoid arthritis. ? Polio. ? Cerebral palsy.  Having family members who have bunions.  Being born with a foot deformity, such as flat feet or low arches.  Doing activities that put a lot of pressure on the feet, such as ballet dancing. What are the signs or symptoms? The main symptom of a bunion is a noticeable bump on the big toe. Other symptoms may include:  Pain.  Swelling around the big toe.  Redness and inflammation.  Thick or hardened skin on the big toe or between the toes.  Stiffness or loss of motion in the big toe.  Trouble with walking. How is this diagnosed? A bunion may be diagnosed based on your symptoms, medical history, and activities. You may have tests, such as:  X-rays. These allow your health care provider to check the position of the bones in your foot and look for damage to your joint. They also help your health care provider determine the severity of your bunion and the best way to treat it.  Joint aspiration. In this test, a sample of fluid is removed from the toe joint. This test may be done if you are in a lot of pain. It helps rule out diseases that cause painful swelling of the joints, such as arthritis. How is this  treated? Treatment depends on the severity of your symptoms. The goal of treatment is to relieve symptoms and prevent the bunion from getting worse. Your health care provider may recommend:  Wearing shoes that have a wide toe box.  Using bunion pads to cushion the affected area.  Taping your toes together to keep them in a normal position.  Placing a device inside your shoe (orthotics) to help reduce pressure on your toe joint.  Taking medicine to ease pain, inflammation, and swelling.  Applying heat or ice to the affected area.  Doing stretching exercises.  Surgery to remove scar tissue and move the toes back into their normal position. This treatment is rare. Follow these instructions at home: Managing pain, stiffness, and swelling   If directed, put ice on the painful area: ? Put ice in a plastic bag. ? Place a towel between your skin and the bag. ? Leave the ice on for 20 minutes, 2-3 times a day. Activity   If directed, apply heat to the affected area before you exercise. Use the heat source that your health care provider recommends, such as a moist heat pack or a heating pad. ? Place a towel between your skin and the heat source. ? Leave the heat on for 20-30 minutes. ? Remove the heat if your skin turns bright red. This is especially important if you are unable to feel pain,   heat, or cold. You may have a greater risk of getting burned.  Do exercises as told by your health care provider. General instructions  Support your toe joint with proper footwear, shoe padding, or taping as told by your health care provider.  Take over-the-counter and prescription medicines only as told by your health care provider.  Keep all follow-up visits as told by your health care provider. This is important. Contact a health care provider if your symptoms:  Get worse.  Do not improve in 2 weeks. Get help right away if you have:  Severe pain and trouble with walking. Summary  A  bunion is a bump on the base of the big toe that forms when the bones of the big toe joint move out of position.  Bunions can make walking painful.  Treatment depends on the severity of your symptoms.  Support your toe joint with proper footwear, shoe padding, or taping as told by your health care provider. This information is not intended to replace advice given to you by your health care provider. Make sure you discuss any questions you have with your health care provider. Document Released: 01/11/2005 Document Revised: 07/18/2017 Document Reviewed: 05/24/2017 Elsevier Patient Education  2020 [employer]  Pre-Operative Instructions  Congratulations, you have decided to take an important step towards improving your quality of life.  You can be assured that the doctors and staff at [facility] will be with you every step of the way.  Here are some important things you should know:  1. Plan to be at the surgery center/hospital at least 1 (one) hour prior to your scheduled time, unless otherwise directed by the surgical center/hospital staff.  You must have a responsible adult accompany you, remain during the surgery and drive you home.  Make sure you have directions to the surgical center/hospital to ensure you arrive on time. 2. If you are having surgery at [facility] or [facility], you will need a copy of your medical history and physical form from your family physician within one month prior to the date of surgery. We will give you a form for your primary physician to complete.  3. We make every effort to accommodate the date you request for surgery.  However, there are times where surgery dates or times have to be moved.  We will contact you as soon as possible if a change in schedule is required.   4. No aspirin/ibuprofen for one week before surgery.  If you are on aspirin, any non-steroidal anti-inflammatory medications (Mobic, Aleve, Ibuprofen) should not be taken seven  (7) days prior to your surgery.  You make take Tylenol for pain prior to surgery.  5. Medications - If you are taking daily heart and blood pressure medications, seizure, reflux, allergy, asthma, anxiety, pain or diabetes medications, make sure you notify the surgery center/hospital before the day of surgery so they can tell you which medications you should take or avoid the day of surgery. 6. No food or drink after midnight the night before surgery unless directed otherwise by surgical center/hospital staff. 7. No alcoholic beverages 24-hours prior to surgery.  No smoking 24-hours prior or 24-hours after surgery. 8. Wear loose pants or shorts. They should be loose enough to fit over bandages, boots, and casts. 9. Don't wear slip-on shoes. Sneakers are preferred. 10. Bring your boot with you to the surgery center/hospital.  Also bring crutches or a walker if your physician has prescribed it for you.    If you do not have this equipment, it will be provided for you after surgery. 11. If you have not been contacted by the surgery center/hospital by the day before your surgery, call to confirm the date and time of your surgery. 12. Leave-time from work may vary depending on the type of surgery you have.  Appropriate arrangements should be made prior to surgery with your employer. 13. Prescriptions will be provided immediately following surgery by your doctor.  Fill these as soon as possible after surgery and take the medication as directed. Pain medications will not be refilled on weekends and must be approved by the doctor. 14. Remove nail polish on the operative foot and avoid getting pedicures prior to surgery. 15. Wash the night before surgery.  The night before surgery wash the foot and leg well with water and the antibacterial soap provided. Be sure to pay special attention to beneath the toenails and in between the toes.  Wash for at least three (3) minutes. Rinse thoroughly with water and dry well with  a towel.  Perform this wash unless told not to do so by your physician.  Enclosed: 1 Ice pack (please put in freezer the night before surgery)   1 Hibiclens skin cleaner   Pre-op instructions  If you have any questions regarding the instructions, please do not hesitate to call our office.  [place]: [address], [place], [place] [address] -- [phone]  [place]: [address] [place], [place] [address] -- [phone]  [place]: [address]  [place], [place] [address] -- [phone]  [place]: [address], [address], [place],  [address] -- [phone]  Website: [email or web address]  

## 2018-09-22 ENCOUNTER — Telehealth: Payer: Self-pay | Admitting: *Deleted

## 2018-09-22 NOTE — Telephone Encounter (Signed)
"  I called and left a message this morning.  I need to change my surgery date.  Can I reschedule it to September 8?"  He does not have anything available on October 03, 2018.  "Can he do it any other time before my scheduled date?"  Yes, he has time available on October 10, 2018.  "Okay, schedule me for then.  They didn't give me a time before.  Can you tell me what time this surgery will be?"  No, someone from the surgical center will give you a call the Friday or Monday prior to your surgery date and will give you your arrival time.

## 2018-09-22 NOTE — Progress Notes (Signed)
Subjective:   Patient ID: Caroline Jordan, female   DOB: 33 y.o.   MRN: [id number]   HPI Patient presents stating she is been having more problems with her bunion left over right and states that she is not sure but may have had a past bunion surgery but stated is increasingly hard for her to wear shoe gear comfortably.  Patient has tried wider shoes soaks without relief does not smoke likes to be active   Review of Systems  All other systems reviewed and are negative.       Objective:  Physical Exam Vitals signs and nursing note reviewed.  Constitutional:      Appearance: She is well-developed.  Pulmonary:     Effort: Pulmonary effort is normal.  Musculoskeletal: Normal range of motion.  Skin:    General: Skin is warm.  Neurological:     Mental Status: She is alert.     Neurovascular status found to be intact muscle strength is adequate range of motion within normal limits with hyperostosis around the medial aspect first metatarsal head left with redness around the area and pain with palpation.  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  Patient wants surgery and I educated her on distal osteotomy and she is scheduled for consult tentatively scheduled for surgery in the next 3 to 4 weeks  X-rays indicate elevation of the intermetatarsal angle left with deviation of the hallux against the second toe

## 2018-09-28 ENCOUNTER — Telehealth: Payer: Self-pay | Admitting: *Deleted

## 2018-09-28 NOTE — Telephone Encounter (Signed)
DOS 10/10/2018 [place] BUNIONECTOM[facility] FOOT - 28296  [employer]: Effective Date - 09/26/2018 - 09/25/2019  Individual In-Network (Service Year) Deductible $0.00 MET YTD  $4,000.00 remaining  $4,000.00 Plan Amt.   Out-of-Pocket $0.00 MET YTD  $6,000.00 remaining  $6,000.00 Plan Amt.  Advance or in a Physician's Office: 0%  Physician Fees for Surgical and [place] Yes, Prior Authorization may be required by a Network provider.  The notification/prior authorization case information was transmitted on 09/28/2018 at 4:38 PM CDT. The notification/prior authorization reference number is [id number]. Please print this page for your records.

## 2018-09-28 NOTE — Telephone Encounter (Signed)
"  If you could, call me back."  I'm returning your call.  How can I help you?  "I received a call from someone from the surgical center.  They informed me of my financial responsibility.  I may have to cancel it and reschedule it to March of next year.  I will know for sure by September 10.  Will that be enough time for me to give notice about a cancellation?"  Yes, that will be fine.  However the sooner you let us know, the better because we can offer that time slot to someone else.  "Okay, I understand.  I may still be able to go through with it, I'll let you know."  I called and asked Caren Griffins at [facility] to put her case at the end because she may cancel her surgery for October 10, 2018.

## 2018-09-29 ENCOUNTER — Ambulatory Visit: Payer: 59 | Admitting: Podiatry

## 2018-09-29 NOTE — Telephone Encounter (Signed)
The surgery was authorized. The authorization # is [id number].  COVERAGE STATUS  1-1 Code  Description  Coverage Status DECISION DATE    [facility]  Coverage determination is reflected for the facility admission and is not a guarantee of payment for ongoing services. Covered/Approved 09/29/2018  1 [id number] Correction, hallux valgus (bunionectomy) more  Covered/Approved 09/29/2018

## 2018-10-04 ENCOUNTER — Telehealth: Payer: Self-pay | Admitting: *Deleted

## 2018-10-04 NOTE — Telephone Encounter (Signed)
"  I'm calling to let you know that Tuesday is going to be fine for me."  Alright I will leave it as scheduled.

## 2018-10-06 ENCOUNTER — Telehealth: Payer: Self-pay | Admitting: *Deleted

## 2018-10-06 ENCOUNTER — Other Ambulatory Visit: Payer: Self-pay

## 2018-10-06 ENCOUNTER — Encounter: Payer: Self-pay | Admitting: Podiatry

## 2018-10-06 ENCOUNTER — Ambulatory Visit (INDEPENDENT_AMBULATORY_CARE_PROVIDER_SITE_OTHER): Payer: 59 | Admitting: Podiatry

## 2018-10-06 DIAGNOSIS — M2011 Hallux valgus (acquired), right foot: Secondary | ICD-10-CM

## 2018-10-06 DIAGNOSIS — M2012 Hallux valgus (acquired), left foot: Secondary | ICD-10-CM | POA: Diagnosis not present

## 2018-10-06 NOTE — Patient Instructions (Signed)
Pre-Operative Instructions  Congratulations, you have decided to take an important step towards improving your quality of life.  You can be assured that the doctors and staff at [facility] will be with you every step of the way.  Here are some important things you should know:  1. Plan to be at the surgery center/hospital at least 1 (one) hour prior to your scheduled time, unless otherwise directed by the surgical center/hospital staff.  You must have a responsible adult accompany you, remain during the surgery and drive you home.  Make sure you have directions to the surgical center/hospital to ensure you arrive on time. 2. If you are having surgery at [facility] or [facility], you will need a copy of your medical history and physical form from your family physician within one month prior to the date of surgery. We will give you a form for your primary physician to complete.  3. We make every effort to accommodate the date you request for surgery.  However, there are times where surgery dates or times have to be moved.  We will contact you as soon as possible if a change in schedule is required.   4. No aspirin/ibuprofen for one week before surgery.  If you are on aspirin, any non-steroidal anti-inflammatory medications (Mobic, Aleve, Ibuprofen) should not be taken seven (7) days prior to your surgery.  You make take Tylenol for pain prior to surgery.  5. Medications - If you are taking daily heart and blood pressure medications, seizure, reflux, allergy, asthma, anxiety, pain or diabetes medications, make sure you notify the surgery center/hospital before the day of surgery so they can tell you which medications you should take or avoid the day of surgery. 6. No food or drink after midnight the night before surgery unless directed otherwise by surgical center/hospital staff. 7. No alcoholic beverages 24-hours prior to surgery.  No smoking 24-hours prior or 24-hours after  surgery. 8. Wear loose pants or shorts. They should be loose enough to fit over bandages, boots, and casts. 9. Don't wear slip-on shoes. Sneakers are preferred. 10. Bring your boot with you to the surgery center/hospital.  Also bring crutches or a walker if your physician has prescribed it for you.  If you do not have this equipment, it will be provided for you after surgery. 11. If you have not been contacted by the surgery center/hospital by the day before your surgery, call to confirm the date and time of your surgery. 12. Leave-time from work may vary depending on the type of surgery you have.  Appropriate arrangements should be made prior to surgery with your employer. 13. Prescriptions will be provided immediately following surgery by your doctor.  Fill these as soon as possible after surgery and take the medication as directed. Pain medications will not be refilled on weekends and must be approved by the doctor. 14. Remove nail polish on the operative foot and avoid getting pedicures prior to surgery. 15. Wash the night before surgery.  The night before surgery wash the foot and leg well with water and the antibacterial soap provided. Be sure to pay special attention to beneath the toenails and in between the toes.  Wash for at least three (3) minutes. Rinse thoroughly with water and dry well with a towel.  Perform this wash unless told not to do so by your physician.  Enclosed: 1 Ice pack (please put in freezer the night before surgery)   1 Hibiclens skin cleaner     Pre-op instructions  If you have any questions regarding the instructions, please do not hesitate to call our office.  [place]: [address], [place], [place] [address] -- [phone]  [place]: [address] [place], [place] [address] -- [phone]  [place]: [address]  , [place] [address] -- [phone]   Website: [email or web address] 

## 2018-10-06 NOTE — Telephone Encounter (Signed)
DOS 10/10/2018 - Dr. Paulla Dolly added a procedure POSSIBLE [employer] - [id number]  This [employer] member's plan does not currently require a prior authorization for these services. If you have general questions about the prior authorization requirements, please call us at [phone] or visit [email or web address] > Clinician Resources > Advance and Admission Notification Requirements. The number above acknowledges your notification. Please write this number down for future reference. Notification is not a guarantee of coverage or payment.  Decision ID [id number]

## 2018-10-06 NOTE — Progress Notes (Signed)
Subjective:   Patient ID: Caroline Jordan, female   DOB: 33 y.o.   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  We discussed distal versus proximal osteotomy and the pros and cons of both that she does have a 20-year-old and will be very difficult to be nonweightbearing.  I did discuss distal osteotomy and I explained I may not be able to get full correction but I do think I will be able to put it in a very good functioning position and also we will need to consider distal osteotomy.  Patient wants to undergo this and is willing to accept risk of procedure and after extensive review signed consent form and is scheduled for outpatient surgery.  Understands total recovery will take about 6 months and there is no long-term guarantees as to what we can do with this but she is willing to accept risk wants surgery signed consent form and is dispensed air fracture walker with all instructions on usage.  Patient is encouraged to call with questions prior to procedure

## 2018-10-10 ENCOUNTER — Telehealth: Payer: Self-pay | Admitting: Podiatry

## 2018-10-10 ENCOUNTER — Encounter: Payer: Self-pay | Admitting: Podiatry

## 2018-10-10 DIAGNOSIS — M2012 Hallux valgus (acquired), left foot: Secondary | ICD-10-CM

## 2018-10-10 NOTE — Telephone Encounter (Signed)
I reviewed Percocet and blood pressure information and BP may be increased. I informed pt, and instructed to contact her PCP or the doctor that manages her medication and to monitor BP at home and I would inform DR. Regal.

## 2018-10-10 NOTE — Telephone Encounter (Signed)
Patient is on blood pressure medication and wanted to know if the Percocet will affect anything.

## 2018-10-12 ENCOUNTER — Ambulatory Visit: Payer: 59 | Admitting: Podiatry

## 2018-10-18 ENCOUNTER — Ambulatory Visit (INDEPENDENT_AMBULATORY_CARE_PROVIDER_SITE_OTHER): Payer: 59

## 2018-10-18 ENCOUNTER — Ambulatory Visit (INDEPENDENT_AMBULATORY_CARE_PROVIDER_SITE_OTHER): Payer: Self-pay | Admitting: Podiatry

## 2018-10-18 ENCOUNTER — Encounter: Payer: Self-pay | Admitting: Podiatry

## 2018-10-18 ENCOUNTER — Other Ambulatory Visit: Payer: Self-pay

## 2018-10-18 DIAGNOSIS — M2012 Hallux valgus (acquired), left foot: Secondary | ICD-10-CM

## 2018-10-18 DIAGNOSIS — M2011 Hallux valgus (acquired), right foot: Secondary | ICD-10-CM

## 2018-10-18 NOTE — Progress Notes (Signed)
  Subjective:  Patient ID: Caroline Jordan, female    DOB: 02/26/1985,  MRN: [id number]  Chief Complaint  Patient presents with  . Routine Post Op    POV#1 DOS 10/10/2018 [facility] BUNIONECTOMY AND [facility] OSTEOTOMY LT    DOS: See above Procedure: See above  33 y.o. female returns for post-op check. Patient is doing really well. Patient denies any pain.   Review of Systems: Negative except as noted in the HPI. Denies N/V/F/Ch.  Past Medical History:  Diagnosis Date  . Anemia   . History of trichomoniasis   . HSV infection   . Hx: UTI (urinary tract infection)   . Pregnancy induced hypertension     Current Outpatient Medications:  .  fexofenadine (ALLEGRA) 180 MG tablet, Take 180 mg by mouth daily as needed for allergies or rhinitis., Disp: , Rfl:  .  hydrochlorothiazide (MICROZIDE) 12.5 MG capsule, , Disp: , Rfl:  .  metoprolol tartrate (LOPRESSOR) 25 MG tablet, Take 1 tablet (25 mg total) by mouth 2 (two) times daily., Disp: 60 tablet, Rfl: 2 .  ondansetron (ZOFRAN) 4 MG tablet, , Disp: , Rfl:  .  PARoxetine (PAXIL) 20 MG tablet, , Disp: , Rfl:  .  triamcinolone cream (KENALOG) 0.1 %, Apply 1 application topically 2 (two) times daily., Disp: 30 g, Rfl: 0 .  valACYclovir (VALTREX) 500 MG tablet, Take 500 mg by mouth 2 (two) times daily as needed (For outbreaks.). , Disp: , Rfl:   Social History   Tobacco Use  Smoking Status Never Smoker  Smokeless Tobacco Never Used    No Known Allergies Objective:  There were no vitals filed for this visit. There is no height or weight on file to calculate BMI. Constitutional Well developed. Well nourished.  Vascular Foot warm and well perfused. Capillary refill normal to all digits.   Neurologic Normal speech. Oriented to person, place, and time. Epicritic sensation to light touch grossly present bilaterally.  Dermatologic Skin healing well without signs of infection. Skin edges well coapted without signs of infection.  Orthopedic:  Tenderness to palpation noted about the surgical site.    Assessment:   1. Valgus deformity of both great toes    Plan:  Patient was evaluated and treated and all questions answered.  S/p foot surgery left -Progressing as expected post-operatively. -XR: Good alignment and excellent position was noted no hardware failure seen no loosening noted. -WB Status: Weightbearing as tolerated in the cam boot  -Sutures: Incision is well coapted no clinical signs of infection. -Medications: Ibuprofen OTC -Foot redressed.  No follow-ups on file.

## 2018-11-06 ENCOUNTER — Encounter: Payer: Self-pay | Admitting: Podiatry

## 2018-11-06 ENCOUNTER — Ambulatory Visit (INDEPENDENT_AMBULATORY_CARE_PROVIDER_SITE_OTHER): Payer: 59

## 2018-11-06 ENCOUNTER — Other Ambulatory Visit: Payer: 59

## 2018-11-06 ENCOUNTER — Ambulatory Visit (INDEPENDENT_AMBULATORY_CARE_PROVIDER_SITE_OTHER): Payer: 59 | Admitting: Podiatry

## 2018-11-06 ENCOUNTER — Other Ambulatory Visit: Payer: Self-pay

## 2018-11-06 DIAGNOSIS — M2011 Hallux valgus (acquired), right foot: Secondary | ICD-10-CM

## 2018-11-06 DIAGNOSIS — Z09 Encounter for follow-up examination after completed treatment for conditions other than malignant neoplasm: Secondary | ICD-10-CM

## 2018-11-06 DIAGNOSIS — M2012 Hallux valgus (acquired), left foot: Secondary | ICD-10-CM

## 2018-11-07 ENCOUNTER — Telehealth: Payer: Self-pay | Admitting: Podiatry

## 2018-11-07 DIAGNOSIS — M2011 Hallux valgus (acquired), right foot: Secondary | ICD-10-CM

## 2018-11-07 DIAGNOSIS — Z09 Encounter for follow-up examination after completed treatment for conditions other than malignant neoplasm: Secondary | ICD-10-CM

## 2018-11-07 DIAGNOSIS — M2012 Hallux valgus (acquired), left foot: Secondary | ICD-10-CM

## 2018-11-07 NOTE — Telephone Encounter (Signed)
Pt states she was seen yesterday and received a surgical shoe, when is she to wear it and can she go barefooted. I told pt to wear the surgical shoe as long as it was comfortable, and once uncomfortable go back in to the surgical boot, and wear the surgical boot when out of the house. Pt states she was told she would have PT and wanted to know if she should begin bending the toes now. I told her not unless she was instructed by Dr. Paulla Dolly.

## 2018-11-07 NOTE — Telephone Encounter (Signed)
Questions about post op care

## 2018-11-08 NOTE — Progress Notes (Signed)
Subjective:   Patient ID: Caroline Jordan, female   DOB: 33 y.o.   MRN: [id number]   HPI Patient states doing well with my left foot with minimal discomfort and able to walk without significant pain at this time    ROS      Objective:  Physical Exam  Neurovascular is intact negative Homans sign noted patient's left foot is healing well wound edges well coapted hallux in rectus position with good range of motion     Assessment:  Doing well post distal osteotomy first metatarsal left     Plan:  Reviewed condition recommended the continuation of conservative care and recommended continued anti-inflammatories physical therapy immobilization compression and reappoint in 2 weeks or earlier if needed and also due to some tightness of the joint recommended physical therapy and she is scheduled for physical therapy  X-ray indicates osteotomies healing well with good alignment noted with mild restriction of motion first MPJ

## 2018-11-08 NOTE — Telephone Encounter (Signed)
Hand delivered referral to [facility].

## 2018-11-08 NOTE — Addendum Note (Signed)
Addended by: Harriett Sine D on: 11/08/2018 01:17 PM   Modules accepted: Orders

## 2018-11-08 NOTE — Telephone Encounter (Signed)
Can have physical therapy scheduled

## 2018-12-04 ENCOUNTER — Other Ambulatory Visit: Payer: Self-pay

## 2018-12-04 ENCOUNTER — Ambulatory Visit (INDEPENDENT_AMBULATORY_CARE_PROVIDER_SITE_OTHER): Payer: 59

## 2018-12-04 ENCOUNTER — Encounter: Payer: Self-pay | Admitting: Podiatry

## 2018-12-04 ENCOUNTER — Ambulatory Visit (INDEPENDENT_AMBULATORY_CARE_PROVIDER_SITE_OTHER): Payer: Self-pay | Admitting: Podiatry

## 2018-12-04 DIAGNOSIS — M2012 Hallux valgus (acquired), left foot: Secondary | ICD-10-CM | POA: Diagnosis not present

## 2018-12-11 NOTE — Progress Notes (Signed)
Subjective:   Patient ID: Caroline Jordan, female   DOB: 33 y.o.   MRN: [id number]   HPI Patient states overall doing well and pleased and states physical therapy has been beneficial   ROS      Objective:  Physical Exam  Neurovascular status intact negative Bevelyn Buckles' sign noted patient's left first MPJ is doing well with wound edges well coapted hallux in rectus position     Assessment:  Doing well post osteotomy left     Plan:  H&P reviewed condition and continue range of motion continue physical therapy anti-inflammatory compression and reappoint to recheck  X-ray indicates osteotomies healing well good alignment noted fixation in place joint congruence

## 2019-01-25 ENCOUNTER — Encounter: Payer: 59 | Attending: Nurse Practitioner | Admitting: Registered"

## 2019-01-25 ENCOUNTER — Encounter: Payer: Self-pay | Admitting: Registered"

## 2019-01-25 ENCOUNTER — Other Ambulatory Visit: Payer: Self-pay

## 2019-01-25 DIAGNOSIS — R7303 Prediabetes: Secondary | ICD-10-CM | POA: Insufficient documentation

## 2019-01-25 NOTE — Progress Notes (Signed)
  Medical Nutrition Therapy:  Appt start time: 9:56 end time:  11:01.   Assessment:  Primary concerns today: Per referral, here for prediabetes.    Pt expectations: wants to learn how to read nutrition facts labels, read sodium amounts on labels, and how much she should take in to lose weight to decrease medications related to high blood pressure  Pt states she is here for weight loss to help decrease blood pressure. States her blood pressure was 127/94 in-office and 115/87 at-home. States when she goes to her doctor it automatically increases whereas readings at home are consistently within normal limits. States blood pressure is also within normal limits when walking daily but when she doesn't, it fluctuates. Reports having gym membership and normal blood pressure readings prior to pandemic; states BP was 107/76 even while weighing the same.   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 States she was able to walk more when working from home 5 days/week even after gym was closed.   Pt states she she fasts a lot, drinks a lot of water and green tea. States she breaks fast at 6 pm with orange or ginger. States she is really not a breakfast person, prefers liquids more so than food. States she is not focused on her weight and will not go crazy on the numbers on the scale. States she only weighs when going to appts. Pt states she is trying and feels like she is doing something wong because she has not had these issues before now. States she wants to stop taking medications for blood pressure and be an example for her 33 year old daughter on how to eat as well.   Preferred Learning Style:   No preference indicated   Learning Readiness:   Ready  Change in progress   MEDICATIONS: See list   DIETARY INTAKE:  Usual eating pattern includes 1 meals and 0 snacks per day.  Everyday foods include seafood, pasta.  Avoided foods include none reported.    24-hr recall:  B ( AM):    Snk ( AM):   L ( PM):  Snk ( PM):  D ( PM): salmon + pasta + macaroni salad Snk ( PM):  Beverages: green tea, water (96+ oz)  Usual physical activity: none stated due to cold weather  Estimated energy needs: 1800-2000 calories 200-225 g carbohydrates 135-150 g protein 50-56 g fat  Progress Towards Goal(s):  In progress.   Nutritional Diagnosis:  NB-1.1 Food and nutrition-related knowledge deficit As related to prediabetes and hypertension.  As evidenced by pt verbalizes incomplete information.    Intervention:  Nutrition education and counseling. Pt was educated and counseled on the benefits of eating throughout the day, consequences of fasting extended periods of time, how to have balanced meals, and benefits of physical activity. Discussed how to read nutrition facts labels as well as sodium recommendations. Pt was in agreement with goals listed.  Goals: - Aim to have vegetables with lunch and dinner.  - Great job with water intake! -  Aim for less than 2300 mg of sodium each day.   Teaching Method Utilized:  Visual Auditory Hands on  Handouts given during visit include:  none  Barriers to learning/adherence to lifestyle change: none identified  Demonstrated degree of understanding via:  Teach Back   Monitoring/Evaluation:  Dietary intake, exercise, and body weight in 1 month(s).

## 2019-01-25 NOTE — Patient Instructions (Addendum)
-   Aim to have vegetables with lunch and dinner.   - Great job with water intake!  -  Aim for less than 2300 mg of sodium each day.

## 2019-02-15 ENCOUNTER — Ambulatory Visit: Payer: 59 | Admitting: Registered"

## 2019-03-15 ENCOUNTER — Ambulatory Visit: Payer: 59 | Admitting: Registered"

## 2019-03-22 ENCOUNTER — Other Ambulatory Visit: Payer: Self-pay

## 2019-03-22 ENCOUNTER — Emergency Department (HOSPITAL_COMMUNITY)
Admission: EM | Admit: 2019-03-22 | Discharge: 2019-03-22 | Disposition: A | Discharge status: Home / Self Care | Payer: 59 | Attending: Emergency Medicine | Admitting: Emergency Medicine

## 2019-03-22 ENCOUNTER — Encounter (HOSPITAL_COMMUNITY): Payer: Self-pay | Admitting: Emergency Medicine

## 2019-03-22 DIAGNOSIS — R42 Dizziness and giddiness: Secondary | ICD-10-CM | POA: Diagnosis not present

## 2019-03-22 DIAGNOSIS — Z79899 Other long term (current) drug therapy: Secondary | ICD-10-CM | POA: Diagnosis not present

## 2019-03-22 DIAGNOSIS — R000 Tachycardia, unspecified: Secondary | ICD-10-CM | POA: Diagnosis not present

## 2019-03-22 DIAGNOSIS — D649 Anemia, unspecified: Secondary | ICD-10-CM | POA: Insufficient documentation

## 2019-03-22 DIAGNOSIS — E876 Hypokalemia: Secondary | ICD-10-CM | POA: Diagnosis not present

## 2019-03-22 DIAGNOSIS — R531 Weakness: Secondary | ICD-10-CM | POA: Diagnosis present

## 2019-03-22 LAB — CBC
HCT: 39.6 % (ref 36.0–46.0)
Hemoglobin: 11.6 g/dL — ABNORMAL LOW (ref 12.0–15.0)
MCH: 19.6 pg — ABNORMAL LOW (ref 26.0–34.0)
MCHC: 29.3 g/dL — ABNORMAL LOW (ref 30.0–36.0)
MCV: 66.9 fL — ABNORMAL LOW (ref 80.0–100.0)
Platelets: 405 10*3/uL — ABNORMAL HIGH (ref 150–400)
RBC: 5.92 MIL/uL — ABNORMAL HIGH (ref 3.87–5.11)
RDW: 20.2 % — ABNORMAL HIGH (ref 11.5–15.5)
WBC: 8.8 10*3/uL (ref 4.0–10.5)
nRBC: 0 % (ref 0.0–0.2)

## 2019-03-22 LAB — I-STAT BETA HCG BLOOD, ED (MC, WL, AP ONLY): I-stat hCG, quantitative: <5 m[IU]/mL (ref <5)

## 2019-03-22 LAB — BASIC METABOLIC PANEL
Anion gap: 13 (ref 5–15)
BUN: 8 mg/dL (ref 6–20)
CO2: 24 mmol/L (ref 22–32)
Calcium: 9.4 mg/dL (ref 8.9–10.3)
Chloride: 101 mmol/L (ref 98–111)
Creatinine, Ser: 0.96 mg/dL (ref 0.44–1.00)
GFR calc Af Amer: >60 mL/min (ref >60)
GFR calc non Af Amer: >60 mL/min (ref >60)
Glucose, Bld: 111 mg/dL — ABNORMAL HIGH (ref 70–99)
Potassium: 3.2 mmol/L — ABNORMAL LOW (ref 3.5–5.1)
Sodium: 138 mmol/L (ref 135–145)

## 2019-03-22 MED ORDER — SODIUM CHLORIDE 0.9% FLUSH: 3.0000 mL | Freq: Once | INTRAVENOUS | Status: DC

## 2019-03-22 MED ORDER — POTASSIUM CHLORIDE CRYS ER 20 MEQ PO TBCR
40.0000 meq | EXTENDED_RELEASE_TABLET | Freq: Once | ORAL | Status: AC
  Administered 2019-03-22: 40 meq via ORAL
  Filled 2019-03-22: qty 2

## 2019-03-22 NOTE — ED Provider Notes (Signed)
[facility] EMERGENCY DEPARTMENT Provider Note   CSN: [id number] Arrival date & time: 03/22/19  1521     History Chief Complaint  Patient presents with  . Weakness    Caroline Jordan is a 34 y.o. female history of iron deficiency anemia, presenting to the emergency department with tachycardia.  The patient was referred into the emergency department from her doctor's clinic today after she was found to have sinus tachycardia with a heart rate of 130 bpm (I reviewed her ECG at the bedside), reporting she felt dizzy and weak at that time.  She reports that she started while she was in the doctor's office, and reports to me she does have a strong component of anxiety, and is not sure she began to feel anxious when they mentioned or abnormalities.  She was referred to the emergency department because there was concern that she may be anemic with his heart rate, may need a blood transfusion.  Upon arrival in the ED, while laying in the bed, she feels much better.  She reports in the past she has had a couple seconds of palpitations at different times during the day.  She does not routinely keep track of her heart rate.  She has never lost consciousness.  She is currently on her menses, no different than normal.  Not taking iron because of constipation.  No hemoptysis or asymmetric LE edema. Patient denies personal or family history of DVT or PE. No recent hormone use (including OCP); travel for >6 hours; prolonged immobilization for greater than 3 days; surgeries or trauma in the last 4 weeks; or malignancy with treatment within 6 months.   HPI     Past Medical History:  Diagnosis Date  . Anemia   . History of trichomoniasis   . HSV infection   . Hx: UTI (urinary tract infection)   . Pregnancy induced hypertension     Patient Active Problem List   Diagnosis Date Noted  . Active labor 07/15/2013  . Labor without complication [id number]    Past Surgical  History:  Procedure Laterality Date  . EXTERNAL EAR SURGERY     keloids     OB History    Gravida  2   Para  1   Term  1   Preterm  0   AB  1   Living  1     SAB  0   TAB  1   Ectopic  0   Multiple  0   Live Births  1           Family History  Problem Relation Age of Onset  . Hypertension Father     Social History   Tobacco Use  . Smoking status: Never Smoker  . Smokeless tobacco: Never Used  Substance Use Topics  . Alcohol use: No    Comment: social  . Drug use: No    Home Medications Prior to Admission medications   Medication Sig Start Date End Date Taking? Authorizing Provider  amLODipine (NORVASC) 2.5 MG tablet Take 2.5 mg by mouth at bedtime.  01/30/19  Yes [provider]  Ferrous Sulfate (IRON PO) Take 20 mLs by mouth daily.   Yes [provider]  hydrochlorothiazide (MICROZIDE) 12.5 MG capsule Take 12.5 mg by mouth daily.  09/11/18  Yes [provider]  valACYclovir (VALTREX) 500 MG tablet Take 500 mg by mouth 2 (two) times daily as needed (For outbreaks.).    Yes [provider]  metoprolol tartrate (LOPRESSOR) 25 MG tablet Take 1 tablet (25 mg total) by mouth 2 (two) times daily. Patient not taking: Reported on 03/22/2019 07/16/18   Orpah Greek, MD  triamcinolone cream (KENALOG) 0.1 % Apply 1 application topically 2 (two) times daily. Patient not taking: Reported on 03/22/2019 10/08/15   Lorayne Bender, PA-C    Allergies    Patient has no known allergies.  Review of Systems   Review of Systems  Constitutional: Negative for chills and fever.  Respiratory: Negative for cough and shortness of breath.   Cardiovascular: Positive for palpitations. Negative for chest pain.  Gastrointestinal: Negative for abdominal pain and vomiting.  Genitourinary: Negative for dysuria and hematuria.  Skin: Negative for color change and rash.  Neurological: Positive for light-headedness. Negative for syncope.  All  other systems reviewed and are negative.   Physical Exam Updated Vital Signs BP 126/88 (BP Location: Left Arm)   Pulse 88   Temp 99 F (37.2 C) (Oral)   Resp 18   Ht 4\' 11"  (1.499 m)   Wt 92.5 kg   SpO2 100%   BMI 41.20 kg/m   Physical Exam Vitals and nursing note reviewed.  Constitutional:      General: She is not in acute distress.    Appearance: She is well-developed.  HENT:     Head: Normocephalic and atraumatic.  Eyes:     Conjunctiva/sclera: Conjunctivae normal.  Cardiovascular:     Rate and Rhythm: Normal rate and regular rhythm.     Pulses: Normal pulses.     Heart sounds: No murmur.  Pulmonary:     Effort: Pulmonary effort is normal. No respiratory distress.     Breath sounds: Normal breath sounds.  Abdominal:     Palpations: Abdomen is soft.     Tenderness: There is no abdominal tenderness.  Musculoskeletal:     Cervical back: Neck supple.  Skin:    General: Skin is warm and dry.  Neurological:     General: No focal deficit present.     Mental Status: She is alert and oriented to person, place, and time.  Psychiatric:        Mood and Affect: Mood normal.        Behavior: Behavior normal.     ED Results / Procedures / Treatments   Labs (all labs ordered are listed, but only abnormal results are displayed) Labs Reviewed  BASIC METABOLIC PANEL - Abnormal; Notable for the following components:      Result Value   Potassium 3.2 (*)    Glucose, Bld 111 (*)    All other components within normal limits  CBC - Abnormal; Notable for the following components:   RBC 5.92 (*)    Hemoglobin 11.6 (*)    MCV 66.9 (*)    MCH 19.6 (*)    MCHC 29.3 (*)    RDW 20.2 (*)    Platelets 405 (*)    All other components within normal limits  I-STAT BETA HCG BLOOD, ED ([facility], [facility], [facility] ONLY)    EKG EKG Interpretation  Date/Time:  Thursday March 22 2019 15:25:03 EST Ventricular Rate:  93 PR Interval:  166 QRS Duration: 80 QT Interval:  340 QTC  Calculation: 422 R Axis:   77 Text Interpretation: Normal sinus rhythm with sinus arrhythmia Normal ECG No STEMI Confirmed by Octaviano Glow [phone]) on 03/22/2019 4:41:11 PM   Radiology No results found.  Procedures Procedures (including critical care time)  Medications Ordered in ED  Medications  potassium chloride SA (KLOR-CON) CR tablet 40 mEq (40 mEq Oral Given 03/22/19 1726)    ED Course  I have reviewed the triage vital signs and the nursing notes.  Pertinent labs & imaging results that were available during my care of the patient were reviewed by me and considered in my medical decision making (see chart for details).  34 yo very pleasant female presenting from office today with concern for tachycardia and lightheadedness/weakness.  Her office ECG shows sinus tach with HR 130 bpm.  Here in the ED her HR has been within normal limits.  She does become mildly tachycardic when i'm in the room discussing her workup and results, or when she looks at the monitor, suggesting to me there may be an anxiety component driving her HR.  Her blood pressure is stable, however, and I don't think this is a clinically significant arrhythmia, or infection, or anemia causing her symptoms.  Her acute workup is largely unremarkable.  PERC negative - low risk for PE.  Fluctuations in HR with ranges well within normal limits makes PE less likely.  No hypoxia here either.  Okay for discharge.  Advised monitoring HR at home and keeping log.  She has an apple watch to do this.  We also discussed the possibility of SVT's at home, although I don't see it here.  I advised checking HR if she develops palpitations or lightheadedness.  We discussed vagal manuevers (cold water on face, bearing down with breath holding) to attempt if her HR is elevated (above 160 bpm).  I'm not convinced she is having SVT's however; this was simply precautionary.  Mild hypoK, repleted orally here, advised adding K+ to her daily  diet, and she will do so.   Final Clinical Impression(s) / ED Diagnoses Final diagnoses:  Tachycardia  Hypokalemia    Rx / DC Orders ED Discharge Orders    None       Bryona Foxworthy, Carola Rhine, MD 03/23/19 [phone]

## 2019-03-22 NOTE — Discharge Instructions (Addendum)
Your potassium today was mildly low (3.2).  I recommended that you add some potassium containing food to your diet.  Your hemoglobin was 11.6.  This is better than your baseline level, and as I explained, I did not think you were having symptoms of anemia, and I did not think you needed a blood transfusion.  Your heart rate resting in the room ranged between 78 beats per minute and 110 beats per minute.  It varied from moment to moment, suggesting there may be a strong anxiety component to your heart rate.  I recommended that you monitor your heart rate at home and keep a daily log of it, particularly if you have palpitations.  I included information about SVT to read over.  I am NOT diagnosing you with this condition at this time.  I simply stated it was a possibility in young patients like you.  Please follow back up with your doctor's office.  Keep yourself hydrated at home.

## 2019-03-22 NOTE — ED Triage Notes (Signed)
Pt in POV, sent by PCP for abnormal EKG (sinus tachycardia). Pt reports feeling weak since this AM. Pt highly anxious in triage, tearful.

## 2019-04-09 ENCOUNTER — Other Ambulatory Visit: Payer: Self-pay | Admitting: *Deleted

## 2019-04-09 ENCOUNTER — Telehealth: Payer: Self-pay | Admitting: *Deleted

## 2019-04-09 DIAGNOSIS — R002 Palpitations: Secondary | ICD-10-CM

## 2019-04-09 NOTE — Telephone Encounter (Signed)
Patient enrolled for [employer] to ship a 30 day cardiac event monitor to her home.  Instructions included in the monitor kit. 

## 2019-04-16 ENCOUNTER — Ambulatory Visit (INDEPENDENT_AMBULATORY_CARE_PROVIDER_SITE_OTHER): Payer: 59

## 2019-04-16 ENCOUNTER — Encounter: Payer: Self-pay | Admitting: Interventional Cardiology

## 2019-04-16 DIAGNOSIS — R002 Palpitations: Secondary | ICD-10-CM | POA: Diagnosis not present

## 2019-04-23 ENCOUNTER — Telehealth: Payer: Self-pay | Admitting: Nurse Practitioner

## 2019-04-23 NOTE — Telephone Encounter (Signed)
Follow up  ° ° °Pt is returning call  ° ° °Please call back  °

## 2019-04-23 NOTE — Telephone Encounter (Signed)
All of the abnormal monitor strips from 3/27 and 3/28 were reviewed by Dr. Caryl Comes. He advised that there are some variations in her rhythm and he cannot yet determine whether she is having atrial tachycardia or sinus tachycardia. He recommends that we continue to monitor and that she should be referred for an appointment with one of the EP cardiologists. I am forwarding message to Dr. Criss Rosales, PCP.  Received call back from patient with questions about her monitor. She states she did have some wine on Saturday and Sunday but not an excessive amount. She states she was asleep at the time of the event 0430 on 3/28. She reports she was riding a bike and doing a lot of walking over the weekend. She admits she may have been dehydrated. She is aware that Dr. Caryl Comes recommends she complete the 30 day monitor and then have an appointment with one of our EP doctors. The patient verbalized understanding and agreement with plan and thanked me for the call.

## 2019-04-23 NOTE — Telephone Encounter (Signed)
The monitor reports and the notes from the phone call were faxed to Dr. Clayburn Pert, PCP

## 2019-04-23 NOTE — Telephone Encounter (Signed)
Received multiple monitor reports from 3/27 and 3/28 on patient that recorded sinus tach and SVT. I called patient to ask about symptoms and she states she traveled to the beach over those days and denies feelings of fast HR, dizziness, or pre-syncope. She states the monitor has been showing poor contact because she takes 2 showers per day. She states she has some skin irritation so she is currently not wearing it but will be reapplying the monitor later today. I advised that I will forward the message to Dr. Criss Rosales with review of the monitor strips by our cardiologist. She asks about scheduling an appointment and I advised that she should first follow-up with Dr. Criss Rosales. She verbalized understanding and agreement with plan and thanked me for the call.

## 2019-04-27 ENCOUNTER — Telehealth: Payer: Self-pay

## 2019-04-27 NOTE — Telephone Encounter (Signed)
Monitor report received that showed SVT and sinus tachycardia from 04/01. I spoke with the patient who states that she did not have any symptoms yesterday afternoon. She does not remember what she was doing at the time but states she was probably just doing things around her house. Advised the patient to continue wearing the heart monitor and let us know if she develops any symptoms or has any episodes of passing out.  See previous phone note in regards to reports that were received 03/27 and 03/28. Patient will continue to wear the monitor and follow up with one of our EP doctors. Patient verbalized understanding.

## 2019-04-27 NOTE — Telephone Encounter (Signed)
Follow Up:   Pt says she talked to you earlier today. She would like to talk to you again please.

## 2019-04-27 NOTE — Telephone Encounter (Signed)
Patient calling back to let me know that yesterday around 4:30pm when we received a trigger from her heart monitor that she was changing her monitor and taking a shower. She also states that she wanted [place] to know that she is pregnant and is terminating the pregnancy tomorrow morning. Her appointment is at 8:30am.

## 2019-04-30 ENCOUNTER — Telehealth: Payer: Self-pay | Admitting: Internal Medicine

## 2019-04-30 NOTE — Telephone Encounter (Signed)
Patient is requesting to have her heart monitor strips read from this weekend. She is not a patient yet but will be a patient of EP. Looks like Dr. Caryl Comes read some abnormal strips from her heart monitor on 03/27 and 03/28

## 2019-04-30 NOTE — Telephone Encounter (Signed)
I spoke to the patient who called to see if we received any notifications of her heart monitor from the weekend.  She just wants "to stay on top of it" so she was inquiring.  She completes on 05/15/19.  I told her that we would reach out to her if we get any notifications.  She verbalized understanding.

## 2019-05-07 ENCOUNTER — Telehealth: Payer: Self-pay

## 2019-05-07 NOTE — Telephone Encounter (Addendum)
Called Dr. Fransico Setters office and spoke with Velva Harman  to advise them that I am faxing the pts Day 19 of her 30 day monitor that was placed for tachycardia. [employer] sent this strip to [place] showing Tachycardia with RR intervals but stable per Dr. Caryl Comes DOD... his recommendations is the pt may see Cardiology/ EP if Dr. Criss Rosales desires.. if not pt to follow up with her PCP.   Unable to LM or speak with the pt... phone recording notes she is not accepting any incoming calls.   Dr. Criss Rosales [phone] Fax [phone]

## 2019-05-29 ENCOUNTER — Other Ambulatory Visit: Payer: Self-pay

## 2019-05-29 ENCOUNTER — Encounter: Payer: Self-pay | Admitting: Cardiology

## 2019-05-29 ENCOUNTER — Ambulatory Visit: Payer: 59 | Admitting: Cardiology

## 2019-05-29 VITALS — BP 136/72 | HR 89 | Ht 59.0 in | Wt 187.0 lb

## 2019-05-29 DIAGNOSIS — R002 Palpitations: Secondary | ICD-10-CM

## 2019-05-29 DIAGNOSIS — I471 Supraventricular tachycardia: Secondary | ICD-10-CM

## 2019-05-29 MED ORDER — METOPROLOL SUCCINATE ER 50 MG PO TB24: 50.0000 mg | ORAL_TABLET | Freq: Every day | ORAL | 3 refills | Status: DC

## 2019-05-29 NOTE — Patient Instructions (Signed)
Medication Instructions:  Your physician has recommended you make the following change in your medication:  1. STOP Amlodipine (Norvasc) 2. START Metoprolol Succinate (Toprol) 50 mg once daily  *If you need a refill on your cardiac medications before your next appointment, please call your pharmacy*   Lab Work: None ordered   Testing/Procedures: None ordered   Follow-Up: At [facility], you and your health needs are our priority.  As part of our continuing mission to provide you with exceptional heart care, we have created designated Provider Care Teams.  These Care Teams include your primary Cardiologist (physician) and Advanced Practice Providers (APPs -  Physician Assistants and Nurse Practitioners) who all work together to provide you with the care you need, when you need it.  We recommend signing up for the patient portal called "MyChart".  Sign up information is provided on this After Visit Summary.  MyChart is used to connect with patients for Virtual Visits (Telemedicine).  Patients are able to view lab/test results, encounter notes, upcoming appointments, etc.  Non-urgent messages can be sent to your provider as well.   To learn more about what you can do with MyChart, go to [email or web address].    Your next appointment:   3 month(s)  The format for your next appointment:   In Person  Provider:   Allegra Lai, MD   Thank you for choosing [facility]!!   Trinidad Curet, RN [phone]    Other Instructions  Metoprolol Extended-Release Tablets What is this medicine? METOPROLOL (me TOE proe lole) is a beta blocker. It decreases the amount of work your heart has to do and helps your heart beat regularly. It treats high blood pressure and/or prevent chest pain (also called angina). It also treats heart failure. This medicine may be used for other purposes; ask your health care provider or pharmacist if you have questions. COMMON BRAND NAME(S): toprol,  Toprol XL What should I tell my health care provider before I take this medicine? They need to know if you have any of these conditions:  diabetes  heart or vessel disease like slow heart rate, worsening heart failure, heart block, sick sinus syndrome or Raynaud's disease  kidney disease  liver disease  lung or breathing disease, like asthma or emphysema  pheochromocytoma  thyroid disease  an unusual or allergic reaction to metoprolol, other beta-blockers, medicines, foods, dyes, or preservatives  pregnant or trying to get pregnant  breast-feeding How should I use this medicine? Take this drug by mouth. Take it as directed on the prescription label at the same time every day. Take it with food. You may cut the tablet in half if it is scored (has a line in the middle of it). This may help you swallow the tablet if the whole tablet is too big. Be sure to take both halves. Do not take just one-half of the tablet. Keep taking it unless your health care provider tells you to stop. Talk to your health care provider about the use of this drug in children. While it may be prescribed for children as young as 6 for selected conditions, precautions do apply. Overdosage: If you think you have taken too much of this medicine contact a poison control center or emergency room at once. NOTE: This medicine is only for you. Do not share this medicine with others. What if I miss a dose? If you miss a dose, take it as soon as you can. If it is almost time for your next dose,  take only that dose. Do not take double or extra doses. What may interact with this medicine? This medicine may interact with the following medications:  certain medicines for blood pressure, heart disease, irregular heart beat  certain medicines for depression, like monoamine oxidase (MAO) inhibitors, fluoxetine, or paroxetine  clonidine  dobutamine  epinephrine  isoproterenol  reserpine This list may not describe all  possible interactions. Give your health care provider a list of all the medicines, herbs, non-prescription drugs, or dietary supplements you use. Also tell them if you smoke, drink alcohol, or use illegal drugs. Some items may interact with your medicine. What should I watch for while using this medicine? Visit your doctor or health care professional for regular check ups. Contact your doctor right away if your symptoms worsen. Check your blood pressure and pulse rate regularly. Ask your health care professional what your blood pressure and pulse rate should be, and when you should contact them. You may get drowsy or dizzy. Do not drive, use machinery, or do anything that needs mental alertness until you know how this medicine affects you. Do not sit or stand up quickly, especially if you are an older patient. This reduces the risk of dizzy or fainting spells. Contact your doctor if these symptoms continue. Alcohol may interfere with the effect of this medicine. Avoid alcoholic drinks. This medicine may increase blood sugar. Ask your healthcare provider if changes in diet or medicines are needed if you have diabetes. What side effects may I notice from receiving this medicine? Side effects that you should report to your doctor or health care professional as soon as possible:  allergic reactions like skin rash, itching or hives  cold or numb hands or feet  depression  difficulty breathing  faint  fever with sore throat  irregular heartbeat, chest pain  rapid weight gain   signs and symptoms of high blood sugar such as being more thirsty or hungry or having to urinate more than normal. You may also feel very tired or have blurry vision.  swollen legs or ankles Side effects that usually do not require medical attention (report to your doctor or health care professional if they continue or are bothersome):  anxiety or nervousness  change in sex drive or performance  dry  skin  headache  nightmares or trouble sleeping  short term memory loss  stomach upset or diarrhea This list may not describe all possible side effects. Call your doctor for medical advice about side effects. You may report side effects to FDA at 1-800-FDA-1088. Where should I keep my medicine? Keep out of the reach of children and pets. Store at room temperature between 20 and 25 degrees C (68 and 77 degrees F). Throw away any unused drug after the expiration date. NOTE: This sheet is a summary. It may not cover all possible information. If you have questions about this medicine, talk to your doctor, pharmacist, or health care provider.  2020 Elsevier/Gold Standard (2018-08-24 18:23:00)

## 2019-05-29 NOTE — Progress Notes (Signed)
Electrophysiology Office Note   Date:  05/29/2019   ID:  Caroline Caroline, DOB Jun 05, 1985, MRN [id number]  PCP:  Caroline Hawks, NP  Cardiologist:   Primary Electrophysiologist:  Avionna Bower Meredith Leeds, MD    Chief Complaint: palpitations   History of Present Illness: Caroline Caroline is a 34 y.o. female who is being seen today for the evaluation of palpitations at the request of Caroline Hawks, NP. Presenting today for electrophysiology evaluation.  She has a history significant for pregnancy-induced hypertension.  She presented to the emergency room 03/22/2019 with episodes of tachycardia.  At that time, she was in sinus tachycardia with heart rates in the 130s.  She felt dizzy and weak.  This began when she was at her doctor's office the same day.  She does have a strong component of anxiety.  Today, she denies symptoms of palpitations, chest pain, shortness of breath, orthopnea, PND, lower extremity edema, claudication, dizziness, presyncope, syncope, bleeding, or neurologic sequela. The patient is tolerating medications without difficulties.  She has been unaware of her palpitations.  She says that when she was wearing her monitor, she felt well.  She only felt palpitations when she was called by the monitor company.  Aside from that, she has no main complaints.   Past Medical History:  Diagnosis Date  . Anemia   . History of trichomoniasis   . HSV infection   . HTN (hypertension)   . Hx: UTI (urinary tract infection)   . Murmur   . Pregnancy induced hypertension   . Tachycardia    Past Surgical History:  Procedure Laterality Date  . EXTERNAL EAR SURGERY     keloids     Current Outpatient Medications  Medication Sig Dispense Refill  . Ferrous Sulfate (IRON PO) Take 20 mLs by mouth daily.    . hydrochlorothiazide (MICROZIDE) 12.5 MG capsule Take 12.5 mg by mouth daily.     . Potassium Chloride ER 20 MEQ TBCR Take by mouth.    . triamcinolone cream (KENALOG) 0.1 % Apply  1 application topically 2 (two) times daily. 30 g 0  . valACYclovir (VALTREX) 500 MG tablet Take 500 mg by mouth 2 (two) times daily as needed (For outbreaks.).     Marland Kitchen metoprolol succinate (TOPROL-XL) 50 MG 24 hr tablet Take 1 tablet (50 mg total) by mouth daily. Take with or immediately following a meal. 30 tablet 3   No current facility-administered medications for this visit.    Allergies:   Patient has no known allergies.   Social History:  The patient  reports that she has never smoked. She has never used smokeless tobacco. She reports that she does not drink alcohol or use drugs.   Family History:  The patient's family history includes Healthy in her brother and sister; Heart attack in her sister; Hypertension in her father and sister; Mental illness in her sister.    ROS:  Please see the history of present illness.   Otherwise, review of systems is positive for none.   All other systems are reviewed and negative.    PHYSICAL EXAM: VS:  BP 136/72   Pulse 89   Ht 4\' 11"  (1.499 m)   Wt 187 lb (84.8 kg)   BMI 37.77 kg/m  , BMI Body mass index is 37.77 kg/m. GEN: Well nourished, well developed, in no acute distress  HEENT: normal  Neck: no JVD, carotid bruits, or masses Cardiac: RRR; no murmurs, rubs, or gallops,no edema  Respiratory:  clear to auscultation  bilaterally, normal work of breathing GI: soft, nontender, nondistended, + BS MS: no deformity or atrophy  Skin: warm and dry Neuro:  Strength and sensation are intact Psych: euthymic mood, full affect  EKG:  EKG is ordered today. Personal review of the ekg ordered shows sinus rhythm  Recent Labs: 03/22/2019: BUN 8; Creatinine, Ser 0.96; Hemoglobin 11.6; Platelets 405; Potassium 3.2; Sodium 138    Lipid Panel  No results found for: CHOL, TRIG, HDL, CHOLHDL, VLDL, LDLCALC, LDLDIRECT   Wt Readings from Last 3 Encounters:  05/29/19 187 lb (84.8 kg)  03/22/19 204 lb (92.5 kg)  07/16/18 200 lb (90.7 kg)      Other  studies Reviewed: Additional studies/ records that were reviewed today include: Cardiac monitor 05/24/2019 personally reviewed Review of the above records today demonstrates:  0% atrial fibrillation Less than 1% ventricular ectopic beats Predominant rhythm sinus rhythm with sinus tachycardia. Possible SVT episodes with heart rates up to 200 bpm No symptoms reported   ASSESSMENT AND PLAN:  1.  Sinus tachycardia: Found on cardiac monitoring.  She did have palpitations.  She also has hypertension.  To consolidate her medications and prevent tachycardia, we Lesly Joslyn start her on Toprol-XL 50 mg and stop her amlodipine.  2.  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  She would potentially benefit from EP study and ablation, though she would prefer to avoid that at this time.    Current medicines are reviewed at length with the patient today.   The patient does not have concerns regarding her medicines.  The following changes were made today: Stop amlodipine, start Toprol-XL  Labs/ tests ordered today include:  Orders Placed This Encounter  Procedures  . EKG 12-Lead     Disposition:   FU with Rydan Gulyas 3 months  Signed, Duward Allbritton Meredith Leeds, MD  05/29/2019 9:39 AM     [facility] [address] [place] [address] [phone] (office) [phone] (fax)

## 2019-06-02 ENCOUNTER — Telehealth: Payer: Self-pay | Admitting: Physician Assistant

## 2019-06-02 NOTE — Telephone Encounter (Signed)
   Pt called because she has been on metoprolol for 3 days and today had an itchy circular place on her leg.  It was reddened after her shower, but she put some apple cider vinegar on it.  The itching stopped and the skin darkened.  It is not itching right now.  She is not using any new creams or lotions.  She has not been exposed to any environmental allergens.  She is not aware of any bug bites or stings.  She is concerned that this is an allergic reaction to the metoprolol.  I contacted the pharmacy and discussed the situation with the pharmacist.  The lesion she describes is extremely unlikely to be an allergic reaction to the metoprolol because it is just 1 area and it is on her leg.  I contacted Ms. Dubinsky and discussed the situation with her further.  I requested that she continue the metoprolol unless she gets additional lesions or has any problems with shortness of breath.  She is agreeable to this.  She says her heart rate has improved on the metoprolol and she does not want to stop taking it unless she has to.  She is encouraged to contact us again if she has any questions or concerns.  Rosaria Ferries, PA-C 06/02/2019 11:19 AM

## 2019-06-21 ENCOUNTER — Telehealth: Payer: Self-pay | Admitting: Cardiology

## 2019-06-21 NOTE — Telephone Encounter (Signed)
New Message:    Pt says she wants somebody to call her asap please.  Pt c/o medication issue:  1. Name of Medication: Metoprolol  2. How are you currently taking this medication (dosage and times per day)?  1  time a day  3. Are you having a reaction (difficulty breathing--STAT)?  no  4. What is your medication issue?  Hair coming out a lot

## 2019-06-21 NOTE — Telephone Encounter (Signed)
Advised pt to rule out pregnancy prior to making further advisement on medication. She will take test and let me know next week, as I am out of the office tomorrow. Once she returns call she understands Dr. Curt Bears is agreeable to PRN Diltiazem 30 mg q6h.  But also understands will await results of pregnancy test prior to finalizing advisement. Pt voices how appreciative she is of how quickly I took care of this. She will call me next week, if issues w/ HRs arise over holiday weekend she will call to discuss - as directed earlier.

## 2019-06-21 NOTE — Telephone Encounter (Signed)
Pt reports hair coming out in small clumps since starting Metoprolol.  Pt advised to hold medication. Aware I will forward this to Dr. Curt Bears for advisement.  She is interested in PRN medication if Dr. Curt Bears feels appropriate/safe.  Reports that this issue (ST) stems from her anxiety and she is unwilling to take medication for it due to SE.  Reports she has been doing better handling her anxiety issues and wondering if daily medication is needed.  Will have Dr. Curt Bears review her monitor to determine frequency/length of episodes to determine if PRN medication ok. Advised to call the office if she has any issues w/ HR while we await advisement. Aware that it may be next week before hearing back. Patient verbalized understanding and agreeable to plan.

## 2019-07-03 ENCOUNTER — Encounter: Payer: Self-pay | Admitting: Cardiology

## 2019-07-03 NOTE — Telephone Encounter (Signed)
error 

## 2019-07-03 NOTE — Telephone Encounter (Signed)
Patient calling back.   °

## 2019-07-04 MED ORDER — DILTIAZEM HCL 30 MG PO TABS: 30.0000 mg | ORAL_TABLET | Freq: Four times a day (QID) | ORAL | 2 refills | Status: DC | PRN

## 2019-07-04 NOTE — Telephone Encounter (Signed)
Confirms she is not pregnant, understands contact us if that changes. Aware ok to take Diltiazem q6h PRN for elevated HRs. Advised to call office is she begins to take frequently. Advised to keep f/u in August as scheduled. Patient verbalized understanding and agreeable to plan.

## 2019-07-12 ENCOUNTER — Telehealth: Payer: Self-pay | Admitting: Cardiology

## 2019-07-12 NOTE — Telephone Encounter (Signed)
Returned call to patient who states she is calling to ask when her hair will stop coming out. States she stopped the metoprolol succinate when she talked to [place], Therapist, sports a few weeks ago but she is still loosing hair. She asked for the date that she was advised to stop toprol and I told her 5/27. She is concerned because it has been 3 weeks. I asked if hair loss has slowed and she confirmed. I advised it may take several weeks to resolve and that unfortunately I do not have any information through up to date to offer her. She denies bald spots or any other symptoms r/t scalp or hair. I encouraged her to continue to monitor and call back with additional questions or concerns although there is not much we can offer. She thanked me for the call.

## 2019-07-12 NOTE — Telephone Encounter (Signed)
   Pt c/o medication issue:  1. Name of Medication:   metoprolol succinate (TOPROL-XL) 50 MG 24 hr tablet    2. How are you currently taking this medication (dosage and times per day)?   3. Are you having a reaction (difficulty breathing--STAT)?   4. What is your medication issue? Pt would like to speak with Nurse Venida Jarvis, she said she wanted to ask when her hair stop shedding since she stopped taking this medication

## 2019-07-26 ENCOUNTER — Telehealth: Payer: Self-pay | Admitting: Cardiology

## 2019-07-26 NOTE — Telephone Encounter (Signed)
Patient would like to speak with Sherri, did not want me to take a message.

## 2019-07-27 ENCOUNTER — Encounter: Payer: Self-pay | Admitting: Cardiology

## 2019-07-27 NOTE — Telephone Encounter (Signed)
error 

## 2019-07-27 NOTE — Telephone Encounter (Signed)
Pt reports that yesterday was the first day she had to use a PRN Diltaizem 30 mg. HR went into the 130s after carrying cases of water upstairs to her apartment.  She isn't sure issues occurred b/c of the heat or not drinking enough water recently. She did stop what she was doing, tried breathing exercises to bring down HR but was unsuccessful.  BP 138/100. After taking Diltiazem BP 110/84, HR still 122.    Reports eventually HR returned to normal, no other symptoms experienced during episode besides heart racing. Advised pt to continue monitoring for now.  Advised to call office back if episodes start to occur more frequently and/or medication is not working when she needs it. Will make Dr. Curt Bears aware and call her w/ any different advisement he may have.  She also reports that she has family hx of heart issues.  States that her dad has undergone PCI.   Due to this she would like to have an echocardiogram performed.  Aware I will discuss with Dr. Curt Bears.  Aware that he and I both are on vacation next week and it will be after our return before addressing.  Patient verbalized understanding and agreeable to plan.

## 2019-08-07 NOTE — Telephone Encounter (Signed)
Patient is calling to follow up with [facility] nurse. She is requesting to further discuss possibility of having an echocardiogram. Please call.

## 2019-08-16 NOTE — Telephone Encounter (Signed)
Pt aware that Dr. Curt Bears doesn't feel she needs an echo at this time. Aware can re-discuss at [place] next month w/ Camnitz. Pt agreeable to plan.

## 2019-09-20 ENCOUNTER — Ambulatory Visit: Payer: 59 | Admitting: Cardiology

## 2019-10-10 HISTORY — PX: OTHER SURGICAL HISTORY: SHX169

## 2019-10-30 IMAGING — CT CT ANGIOGRAPHY CHEST
2 of 6 series · 18 of 46 positions shown · IV contrast (ISOVUE)
Comparison: Chest radiograph earlier this day.

CLINICAL DATA: PE suspected, intermediate prob, neg D-dimer. Left
chest pain.

EXAM:
CT ANGIOGRAPHY CHEST WITH CONTRAST
TECHNIQUE: Multidetector CT imaging of the chest was performed using the
standard protocol during bolus administration of intravenous
contrast. Multiplanar CT image reconstructions and MIPs were
obtained to evaluate the vascular anatomy.
CONTRAST:  100mL OMNIPAQUE IOHEXOL 350 MG/ML SOLN

[Series 6: thins · axial · 0.63mm/px · z∈[-270,-23]mm · 15 of 271 slices shown]
[im 12/271  lung]
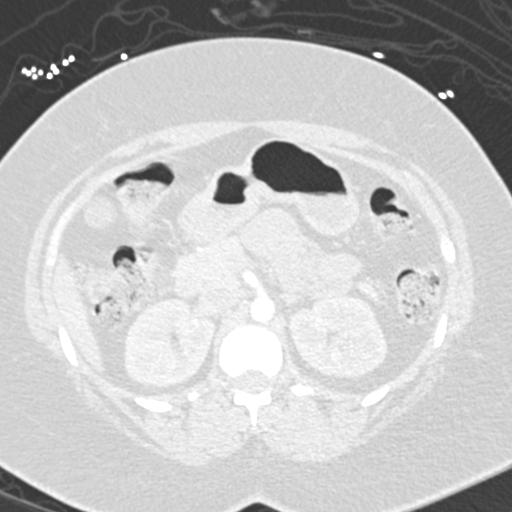
[im 36/271  soft-tissue]
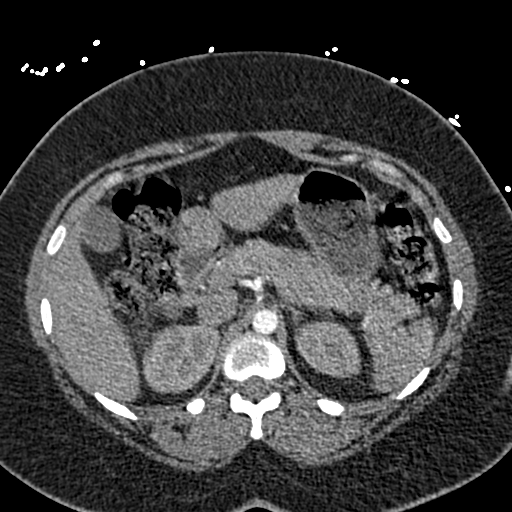
[im 47/271  lung]
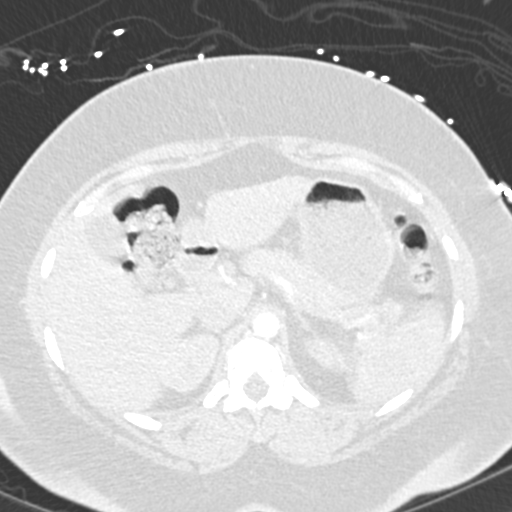
[im 71/271  soft-tissue]
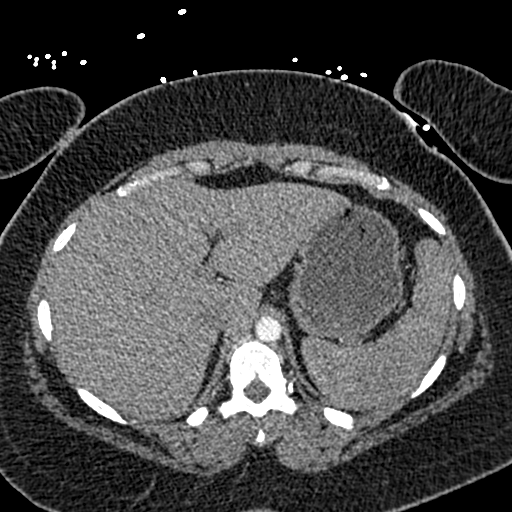
[im 83/271  lung]
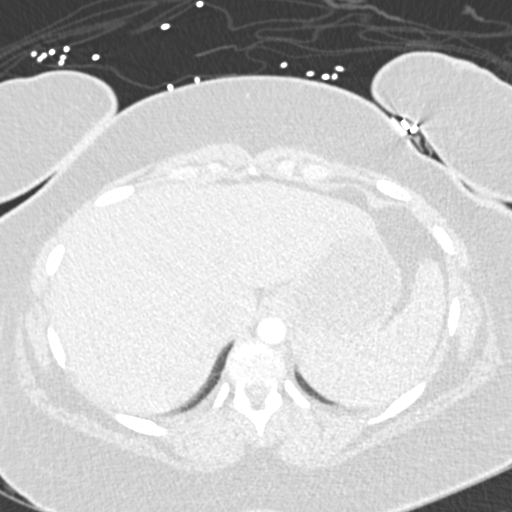
[im 106/271  soft-tissue]
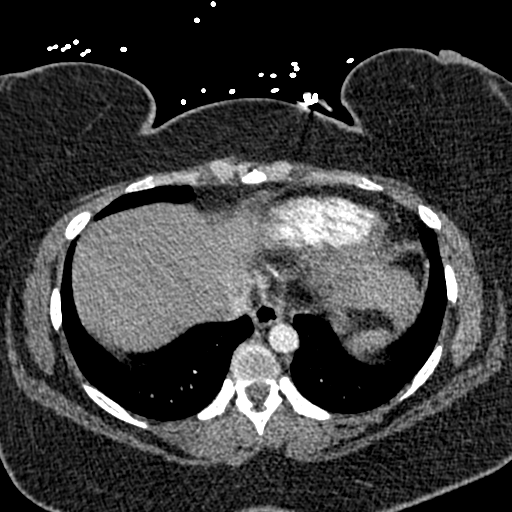
[im 118/271  lung]
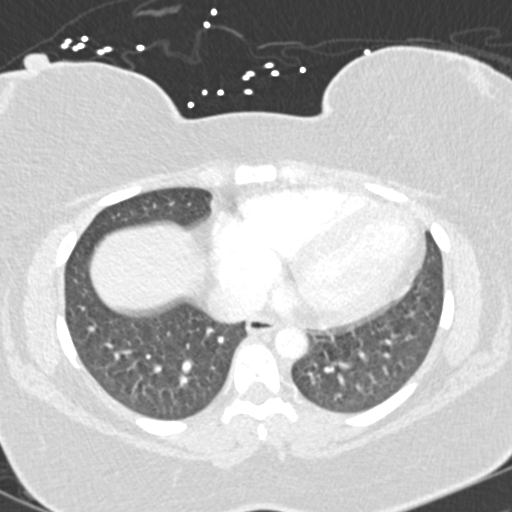
[im 141/271  soft-tissue]
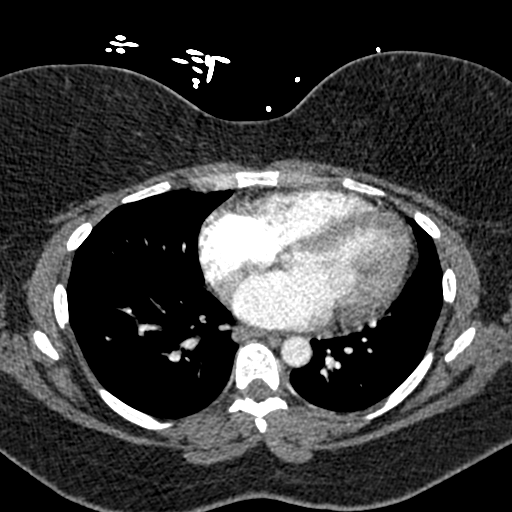
[im 153/271  lung]
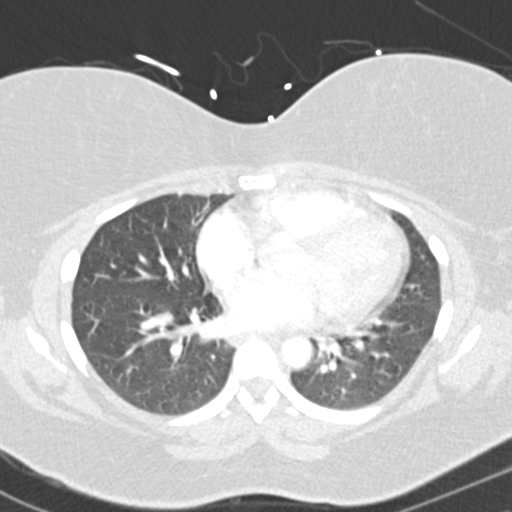
[im 165/271  soft-tissue]
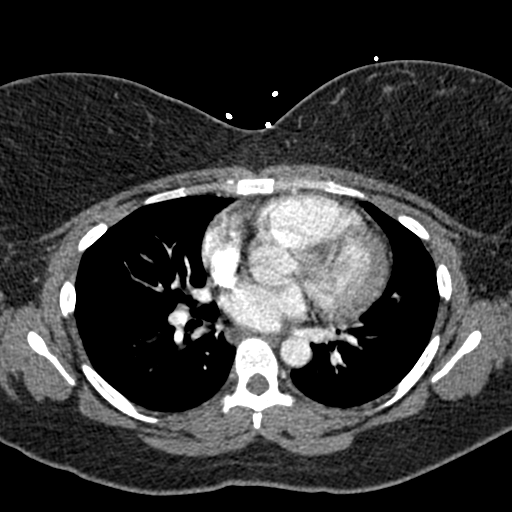
[im 188/271  lung]
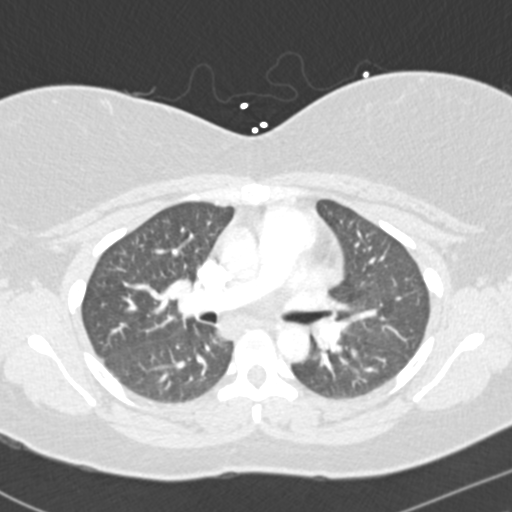
[im 200/271  soft-tissue]
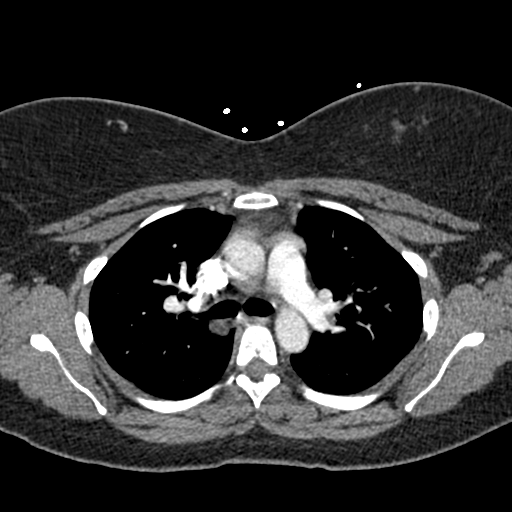
[im 224/271  lung]
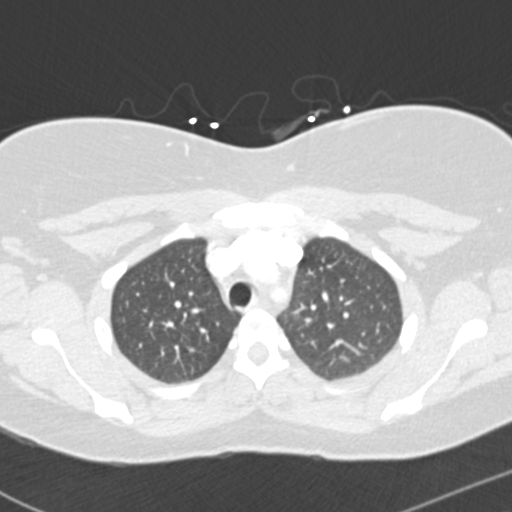
[im 235/271  soft-tissue]
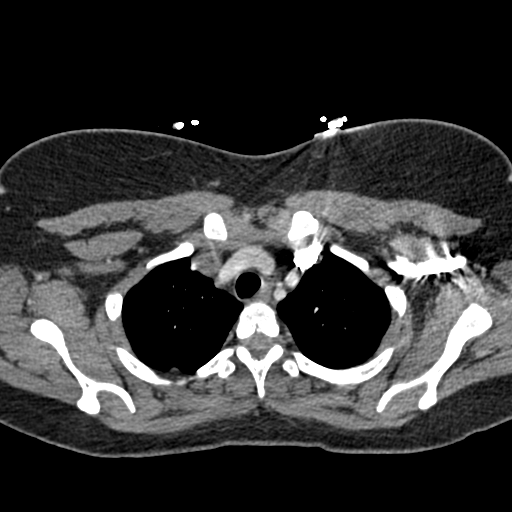
[im 259/271  lung]
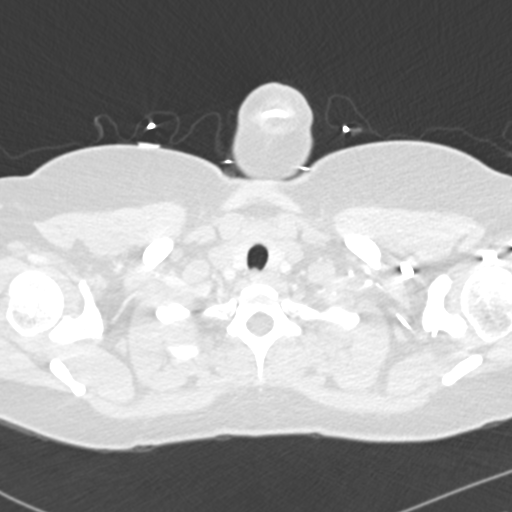

[Series 8: coronal mpr · coronal · 0.55mm/px · 3 of 138 slices shown]
[im 35/138  soft-tissue]
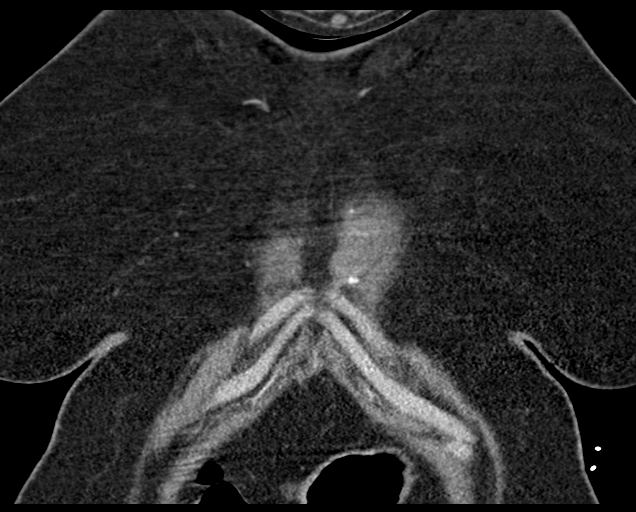
[im 69/138  soft-tissue]
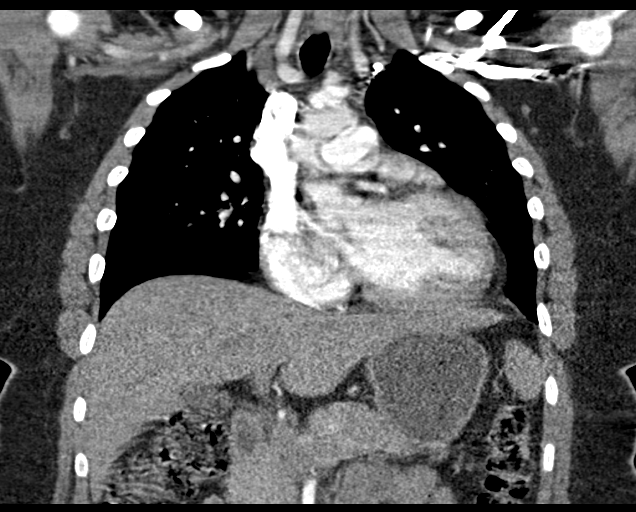
[im 103/138  soft-tissue]
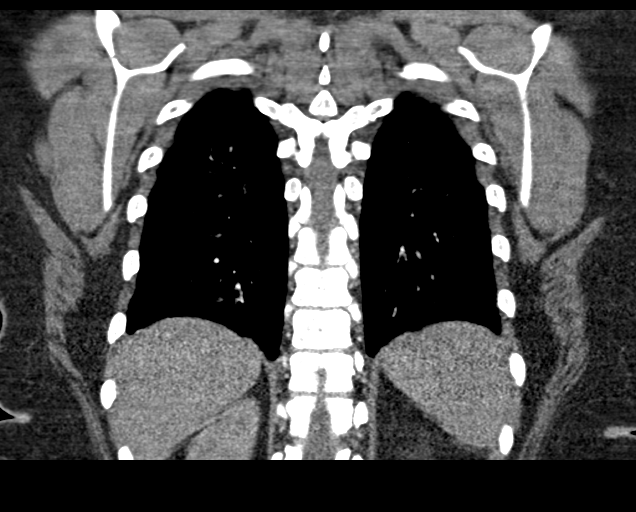

[18 of 46 positions shown; findings below may reference images not displayed]

FINDINGS: Cardiovascular: There are no filling defects within the pulmonary
arteries to suggest pulmonary embolus. Subsegmental branches are not
well assessed due to contrast bolus timing and soft tissue
attenuation from habitus. Heart is normal in size. No pericardial
effusion. No aortic dissection.

Mediastinum/Nodes: No enlarged mediastinal or hilar lymph nodes.
Esophagus is decompressed. No thyroid nodule.

Lungs/Pleura: The lungs are clear. No consolidation. No pulmonary
edema. No pleural fluid.

Upper Abdomen: No acute abnormality.

Musculoskeletal: There are no acute or suspicious osseous
abnormalities.

Review of the MIP images confirms the above findings.
IMPRESSION: No pulmonary embolus or acute intrathoracic abnormality.

## 2019-10-30 IMAGING — CR CHEST - 2 VIEW
2 series · 2 of 2 positions shown · non-contrast
Comparison: None.

CLINICAL DATA: 32 y/o  F; left-sided chest pain and nose bleed.

EXAM:
CHEST - 2 VIEW

[w chest pa]
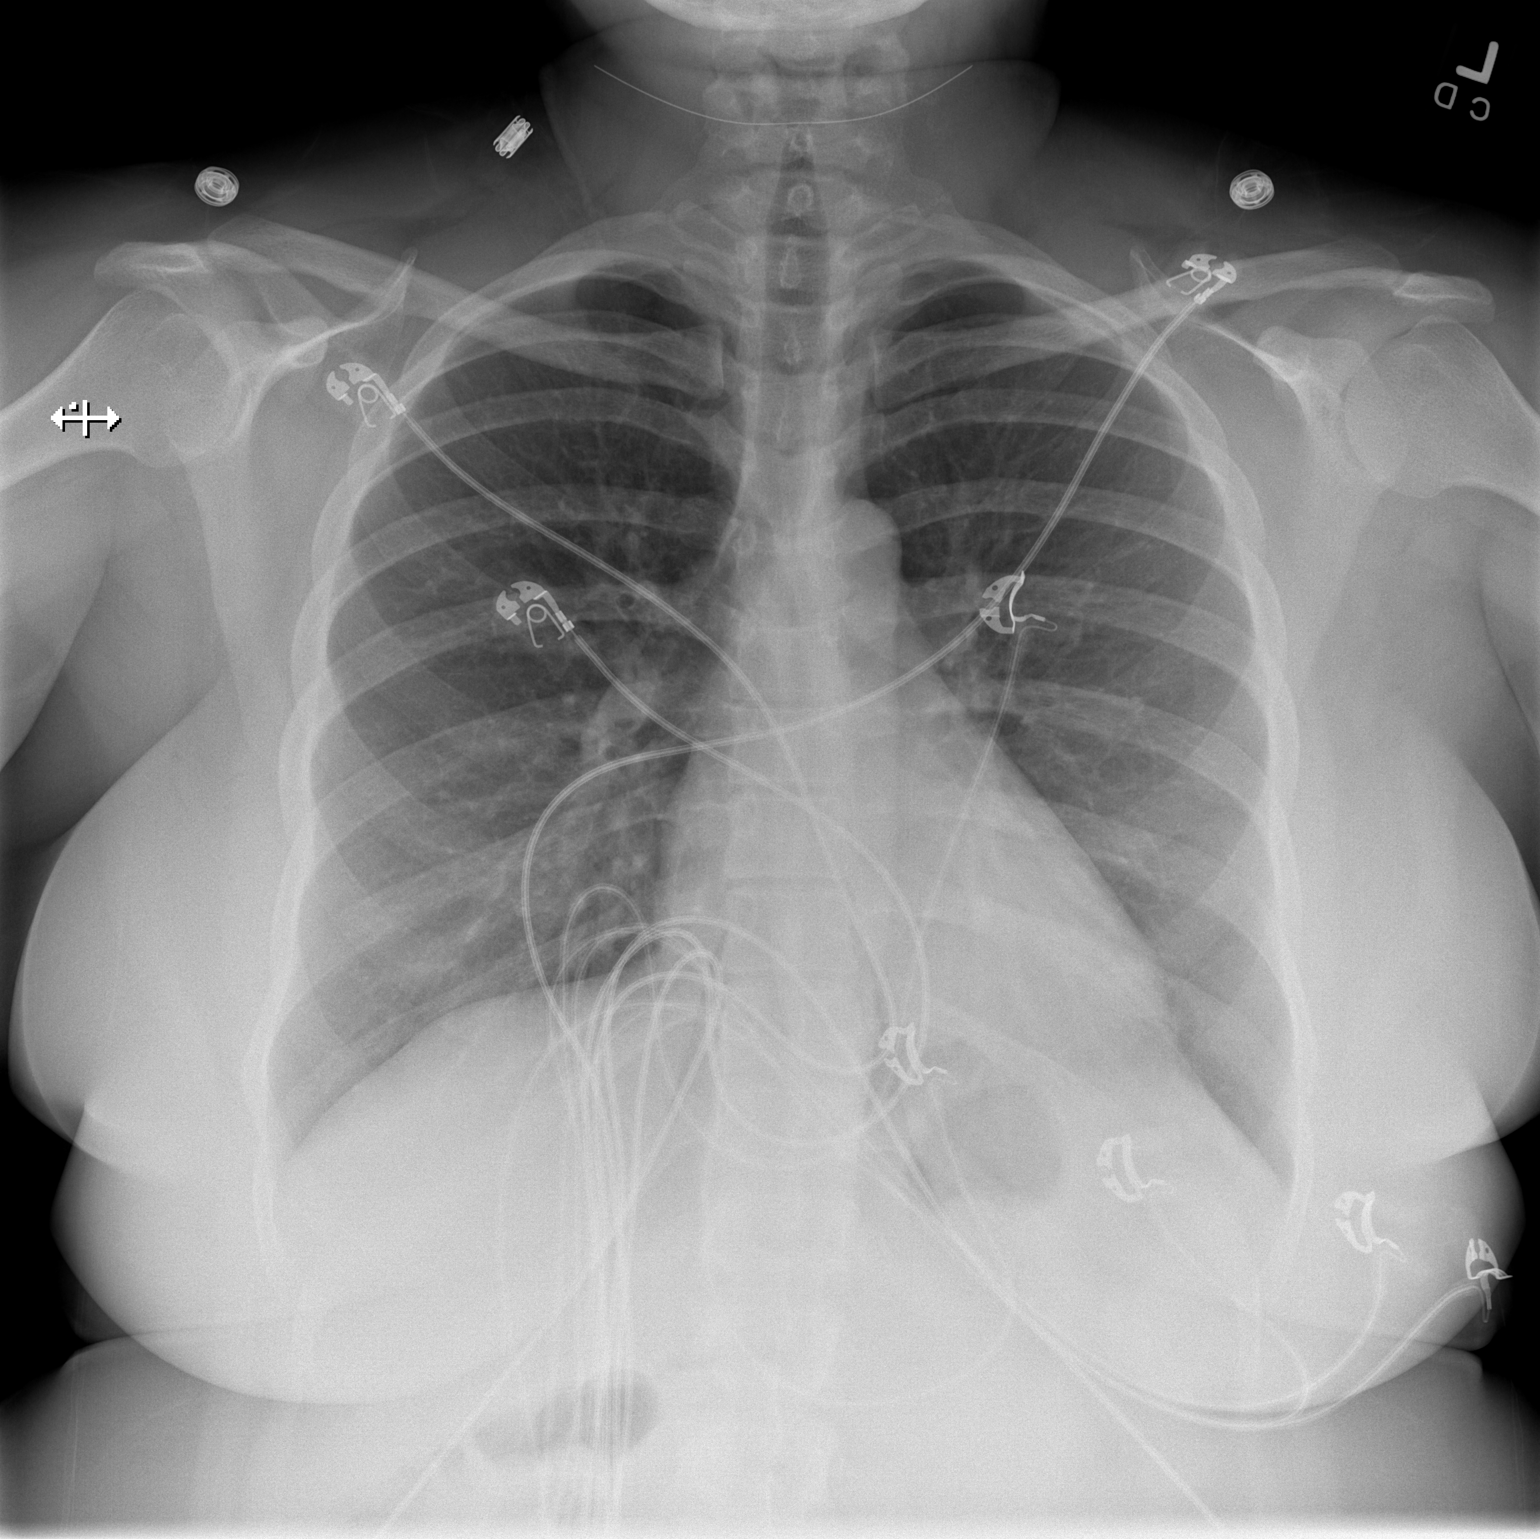

[w chest lat]
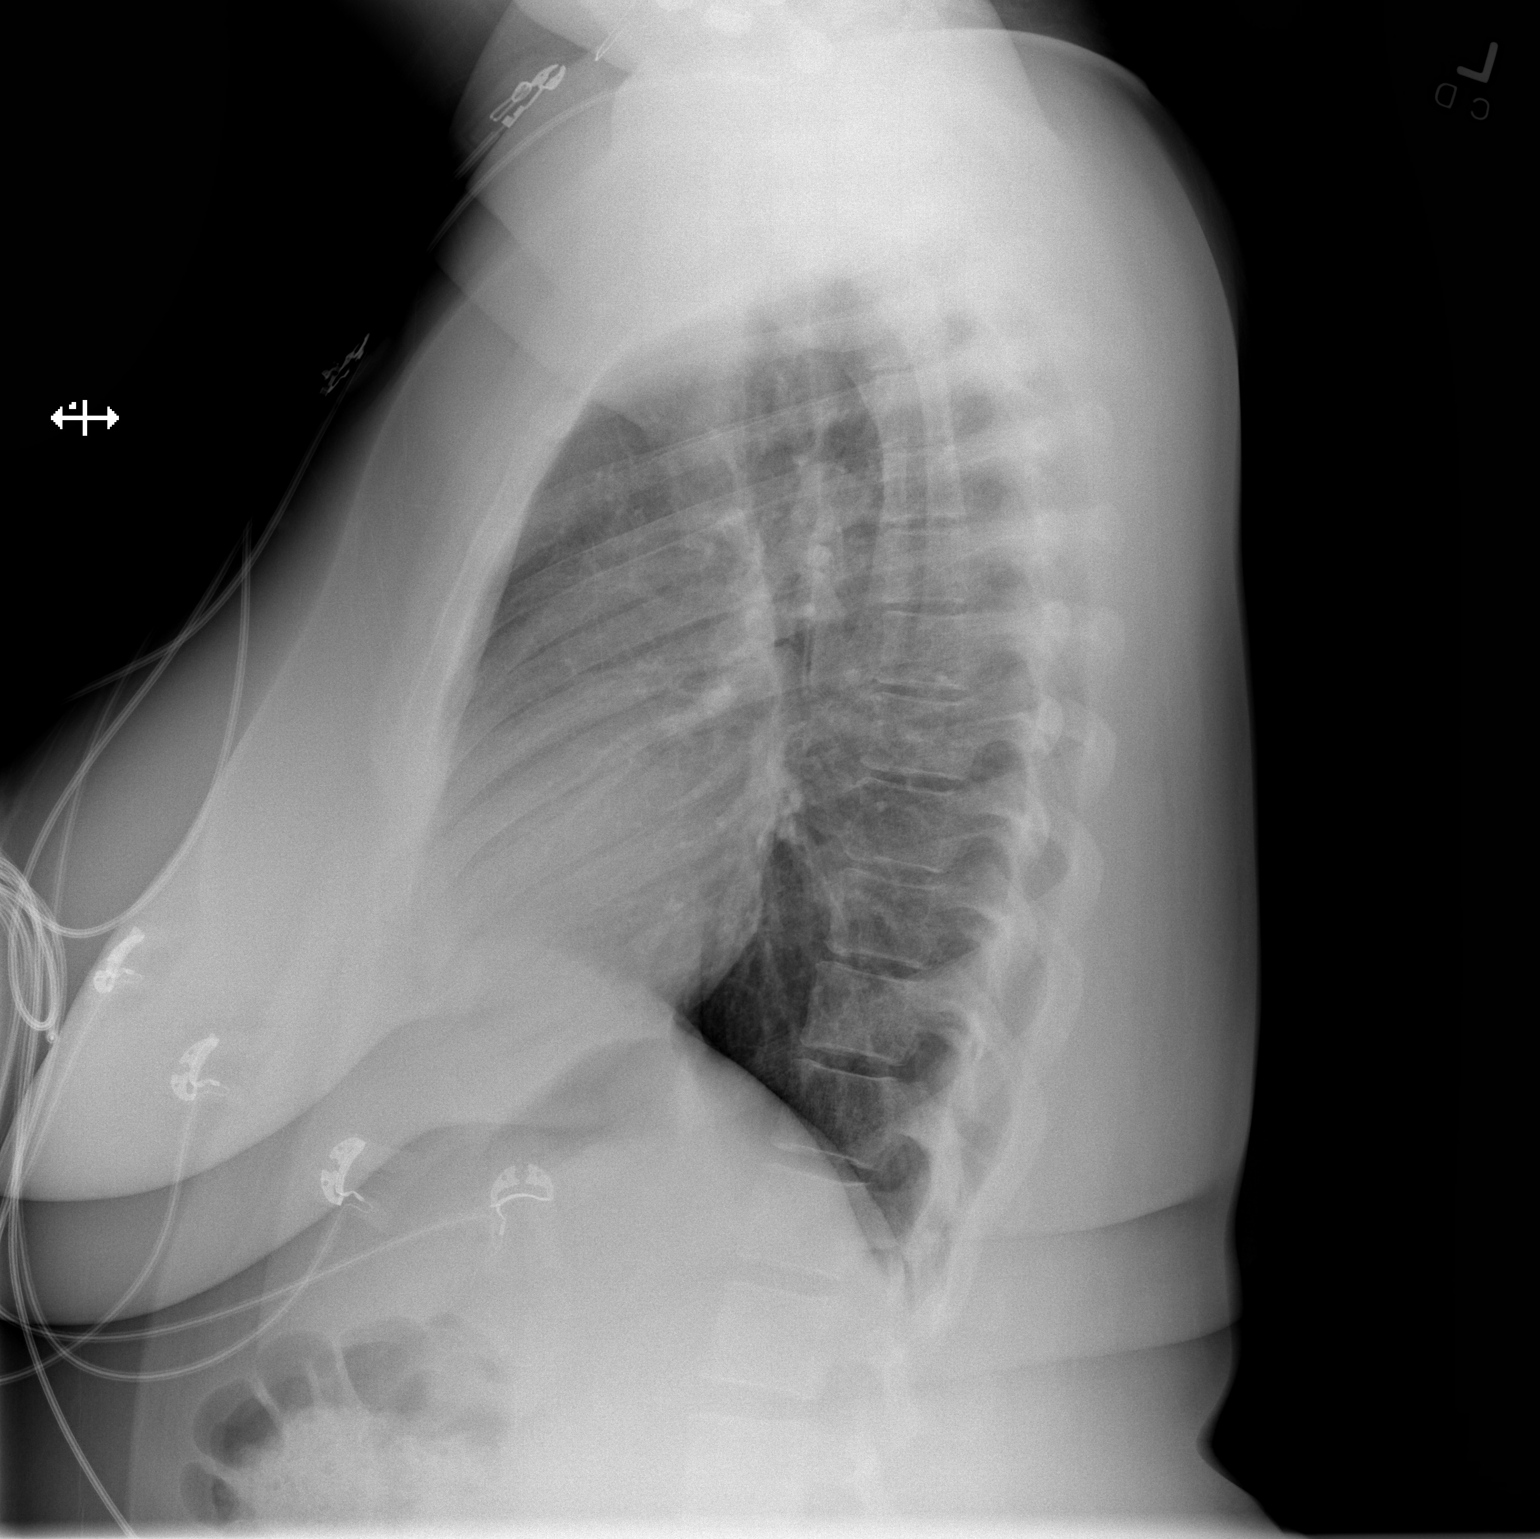

[2 of 2 positions shown; findings below may reference images not displayed]

FINDINGS: The heart size and mediastinal contours are within normal limits.
Both lungs are clear. The visualized skeletal structures are
unremarkable.
IMPRESSION: No active cardiopulmonary disease.

## 2019-12-25 ENCOUNTER — Ambulatory Visit: Payer: 59 | Admitting: Cardiology

## 2019-12-25 NOTE — Progress Notes (Deleted)
Electrophysiology Office Note   Date:  12/25/2019   ID:  Caroline Jordan, DOB 1985-11-15, MRN [id number]  PCP:  Jordan Hawks, NP  Cardiologist:   Primary Electrophysiologist:  Timmie Calix Meredith Leeds, MD    Chief Complaint: palpitations   History of Present Illness: Caroline Jordan is a 34 y.o. female who is being seen today for the evaluation of palpitations at the request of Jordan Hawks, NP. Presenting today for electrophysiology evaluation.  She has a history of pregnancy-induced hypertension.  She went to the emergency room 03/22/2019 with episodes of tachycardia.  At the time she was in sinus tachycardia with heart rates in the 130s.  She felt weak and dizzy.  This began when she was at her doctor's office the same day.  She has been started on metoprolol.  Today, denies symptoms of palpitations, chest pain, shortness of breath, orthopnea, PND, lower extremity edema, claudication, dizziness, presyncope, syncope, bleeding, or neurologic sequela. The patient is tolerating medications without difficulties. ***    Past Medical History:  Diagnosis Date  . Anemia   . History of trichomoniasis   . HSV infection   . HTN (hypertension)   . Hx: UTI (urinary tract infection)   . Murmur   . Pregnancy induced hypertension   . Tachycardia    Past Surgical History:  Procedure Laterality Date  . EXTERNAL EAR SURGERY     keloids     Current Outpatient Medications  Medication Sig Dispense Refill  . diltiazem (CARDIZEM) 30 MG tablet Take 1 tablet (30 mg total) by mouth 4 (four) times daily as needed (for elevated heart rates). 30 tablet 2  . Ferrous Sulfate (IRON PO) Take 20 mLs by mouth daily.    . hydrochlorothiazide (MICROZIDE) 12.5 MG capsule Take 12.5 mg by mouth daily.     . Potassium Chloride ER 20 MEQ TBCR Take by mouth.    . triamcinolone cream (KENALOG) 0.1 % Apply 1 application topically 2 (two) times daily. 30 g 0  . valACYclovir (VALTREX) 500 MG tablet Take 500 mg  by mouth 2 (two) times daily as needed (For outbreaks.).      No current facility-administered medications for this visit.    Allergies:   Patient has no known allergies.   Social History:  The patient  reports that she has never smoked. She has never used smokeless tobacco. She reports that she does not drink alcohol and does not use drugs.   Family History:  The patient's family history includes Healthy in her brother and sister; Heart attack in her sister; Hypertension in her father and sister; Mental illness in her sister.   ROS:  Please see the history of present illness.   Otherwise, review of systems is positive for none.   All other systems are reviewed and negative.   PHYSICAL EXAM: VS:  There were no vitals taken for this visit. , BMI There is no height or weight on file to calculate BMI. GEN: Well nourished, well developed, in no acute distress  HEENT: normal  Neck: no JVD, carotid bruits, or masses Cardiac: ***RRR; no murmurs, rubs, or gallops,no edema  Respiratory:  clear to auscultation bilaterally, normal work of breathing GI: soft, nontender, nondistended, + BS MS: no deformity or atrophy  Skin: warm and dry Neuro:  Strength and sensation are intact Psych: euthymic mood, full affect  EKG:  EKG {ACTION; IS/IS PRF:[id number]} ordered today. Personal review of the ekg ordered *** shows ***   Recent Labs: 03/22/2019: BUN 8; Creatinine,  Ser 0.96; Hemoglobin 11.6; Platelets 405; Potassium 3.2; Sodium 138    Lipid Panel  No results found for: CHOL, TRIG, HDL, CHOLHDL, VLDL, LDLCALC, LDLDIRECT   Wt Readings from Last 3 Encounters:  05/29/19 187 lb (84.8 kg)  03/22/19 204 lb (92.5 kg)  07/16/18 200 lb (90.7 kg)      Other studies Reviewed: Additional studies/ records that were reviewed today include: Cardiac monitor 05/24/2019 personally reviewed Review of the above records today demonstrates:  0% atrial fibrillation Less than 1% ventricular ectopic  beats Predominant rhythm sinus rhythm with sinus tachycardia. Possible SVT episodes with heart rates up to 200 bpm No symptoms reported   ASSESSMENT AND PLAN:  1.  Sinus tachycardia: Currently on Toprol-XL.  Was found on cardiac monitoring.  2.  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  ***    Current medicines are reviewed at length with the patient today.   The patient does not have concerns regarding her medicines.  The following changes were made today: Stop amlodipine, start ***  Labs/ tests ordered today include:  No orders of the defined types were placed in this encounter.    Disposition:   FU with Tera Pellicane *** months  Signed, Carrson Lightcap Meredith Leeds, MD  12/25/2019 8:06 AM     [facility] [address] [place] [address] [phone] (office) [phone] (fax)

## 2020-02-15 ENCOUNTER — Ambulatory Visit: Payer: 59 | Admitting: Nurse Practitioner

## 2020-05-14 ENCOUNTER — Encounter (HOSPITAL_BASED_OUTPATIENT_CLINIC_OR_DEPARTMENT_OTHER): Payer: Self-pay | Admitting: Obstetrics and Gynecology

## 2020-05-14 ENCOUNTER — Other Ambulatory Visit: Payer: Self-pay

## 2020-05-14 NOTE — Progress Notes (Signed)
YOU ARE SCHEDULED FOR A COVID TEST  05-16-2020 @ 930. THIS TEST MUST BE DONE BEFORE SURGERY. GO TO  [facility] [place], [place], IT IS APPROXIMATELY 2 MINUTES PAST [employer] ON THE RIGHT AND REMAIN IN YOUR CAR, THIS IS A DRIVE UP TEST. ONCE YOUR COVID TEST IS DONE PLEASE FOLLOW ALL THE QUARANTINE  INSTRUCTIONS GIVEN IN YOUR HANDOUT.      Your procedure is scheduled on  05-20-2020  Report to [facility]   Call this number if you have problems the morning of surgery  [phone].   OUR ADDRESS IS [address]  WE ARE LOCATED IN [facility].  PLEASE BRING YOUR INSURANCE CARD AND PHOTO ID DAY OF SURGERY.  ONLY ONE PERSON ALLOWED IN FACILITY WAITING AREA.                                     REMEMBER:  DO NOT EAT FOOD, CANDY GUM OR MINTS  AFTER MIDNIGHT . YOU MAY HAVE CLEAR LIQUIDS FROM MIDNIGHT UNTIL 430 am. NO CLEAR LIQUIDS AFTER 430 am DAY OF SURGERY.   YOU MAY  BRUSH YOUR TEETH MORNING OF SURGERY AND RINSE YOUR MOUTH OUT, NO CHEWING GUM CANDY OR MINTS.    CLEAR LIQUID DIET   Foods Allowed                                                                     Foods Excluded  Coffee and tea, regular and decaf                             liquids that you cannot  Plain Jell-O any favor except red or purple                                           see through such as: Fruit ices (not with fruit pulp)                                     milk, soups, orange juice  Iced Popsicles                                    All solid food Carbonated beverages, regular and diet                                    Cranberry, grape and apple juices Sports drinks like Gatorade Lightly seasoned clear broth or consume(fat free) Sugar, honey syrup  Sample Menu Breakfast                                Lunch  Supper Cranberry juice                    Beef broth                            Chicken  broth Jell-O                                     Grape juice                           Apple juice Coffee or tea                        Jell-O                                      Popsicle                                                Coffee or tea                        Coffee or tea  _____________________________________________________________________     TAKE THESE MEDICATIONS MORNING OF SURGERY WITH A SIP OF WATER:  None.  ONE VISITOR IS ALLOWED IN WAITING ROOM ONLY DAY OF SURGERY.  NO VISITOR MAY SPEND THE NIGHT.  VISITOR ARE ALLOWED TO STAY UNTIL 800 PM.                                    DO NOT WEAR JEWERLY, MAKE UP. DO NOT WEAR LOTIONS, POWDERS, PERFUMES OR DEODORANT. DO NOT SHAVE FOR 24 HOURS PRIOR TO DAY OF SURGERY. MEN MAY SHAVE FACE AND NECK. CONTACTS, GLASSES, OR DENTURES MAY NOT BE WORN TO SURGERY.                                    [facility] IS NOT RESPONSIBLE  FOR ANY BELONGINGS.                                                                    Marland Kitchen           [place] - Preparing for Surgery Before surgery, you can play an important role.  Because skin is not sterile, your skin needs to be as free of germs as possible.  You can reduce the number of germs on your skin by washing with CHG (chlorahexidine gluconate) soap before surgery.  CHG is an antiseptic cleaner which kills germs and bonds with the skin to continue killing germs even after washing. Please DO NOT use if you have an allergy to CHG or antibacterial soaps.  If your skin becomes reddened/irritated stop  using the CHG and inform your nurse when you arrive at Short Stay. Do not shave (including legs and underarms) for at least 48 hours prior to the first CHG shower.  You may shave your face/neck. Please follow these instructions carefully:  1.  Shower with CHG Soap the night before surgery and the  morning of Surgery.  2.  If you choose to wash your hair, wash your hair first as usual with your  normal   shampoo.  3.  After you shampoo, rinse your hair and body thoroughly to remove the  shampoo.                            4.  Use CHG as you would any other liquid soap.  You can apply chg directly  to the skin and wash                      Gently with a scrungie or clean washcloth.  5.  Apply the CHG Soap to your body ONLY FROM THE NECK DOWN.   Do not use on face/ open                           Wound or open sores. Avoid contact with eyes, ears mouth and genitals (private parts).                       Wash face,  Genitals (private parts) with your normal soap.             6.  Wash thoroughly, paying special attention to the area where your surgery  will be performed.  7.  Thoroughly rinse your body with warm water from the neck down.  8.  DO NOT shower/wash with your normal soap after using and rinsing off  the CHG Soap.                9.  Pat yourself dry with a clean towel.            10.  Wear clean pajamas.            11.  Place clean sheets on your bed the night of your first shower and do not  sleep with pets. Day of Surgery : Do not apply any lotions/deodorants the morning of surgery.  Please wear clean clothes to the hospital/surgery center.  FAILURE TO FOLLOW THESE INSTRUCTIONS MAY RESULT IN THE CANCELLATION OF YOUR SURGERY PATIENT SIGNATURE_________________________________  NURSE SIGNATURE__________________________________  ________________________________________________________________________                                                        QUESTIONS Caroline Jordan PRE OP NURSE PHONE [phone]

## 2020-05-14 NOTE — Progress Notes (Signed)
Spoke w/ via phone for pre-op interview---PT Lab needs dos----   URINE PREG,       Lab results------has lab appt 05-16-2020 for [facility]bc bmp t & s COVID test ------05-16-2020 855 Arrive at -------530 am 05-20-2020 NPO after MN NO Solid Food.  Clear liquids from MN until---430 am, then npo Med rec completed Medications to take morning of surgery -----none Diabetic medication -----n/a Patient instructed to bring photo id and insurance card day of surgery Patient aware to have Driver (ride ) / caregiver   Sister crystal Showman will drop pt off   for 24 hours after surgery  Patient Special Instructions -----extended stay/possible overnight stay instructions given Pre-Op special Istructions -----none Patient verbalized understanding of instructions that were given at this phone interview. Patient denies shortness of breath, chest pain, fever, cough at this phone interview.  lov dr will camitz 05-29-2019 epic ekg 05-29-2019 epic  Addendum: reviewed patient chart and history  with steven turk mda  Pt ok for lwc barring any acute status chnages per dr Remo Lipps Gifford Shave mda

## 2020-05-16 ENCOUNTER — Encounter (HOSPITAL_COMMUNITY)
Admission: RE | Admit: 2020-05-16 | Discharge: 2020-05-16 | Disposition: A | Discharge status: Home / Self Care | Payer: 59 | Source: Ambulatory Visit | Attending: Obstetrics and Gynecology | Admitting: Obstetrics and Gynecology

## 2020-05-16 ENCOUNTER — Other Ambulatory Visit: Payer: Self-pay

## 2020-05-16 ENCOUNTER — Other Ambulatory Visit (HOSPITAL_COMMUNITY)
Admission: RE | Admit: 2020-05-16 | Discharge: 2020-05-16 | Disposition: A | Discharge status: Home / Self Care | Payer: 59 | Source: Ambulatory Visit | Attending: Obstetrics and Gynecology | Admitting: Obstetrics and Gynecology

## 2020-05-16 ENCOUNTER — Other Ambulatory Visit (HOSPITAL_COMMUNITY): Payer: 59

## 2020-05-16 DIAGNOSIS — Z01812 Encounter for preprocedural laboratory examination: Secondary | ICD-10-CM | POA: Insufficient documentation

## 2020-05-16 DIAGNOSIS — Z20822 Contact with and (suspected) exposure to covid-19: Secondary | ICD-10-CM | POA: Diagnosis not present

## 2020-05-16 LAB — CBC
HCT: 38.8 % (ref 36.0–46.0)
Hemoglobin: 11.4 g/dL — ABNORMAL LOW (ref 12.0–15.0)
MCH: 20.9 pg — ABNORMAL LOW (ref 26.0–34.0)
MCHC: 29.4 g/dL — ABNORMAL LOW (ref 30.0–36.0)
MCV: 71.1 fL — ABNORMAL LOW (ref 80.0–100.0)
Platelets: 359 10*3/uL (ref 150–400)
RBC: 5.46 MIL/uL — ABNORMAL HIGH (ref 3.87–5.11)
RDW: 19.9 % — ABNORMAL HIGH (ref 11.5–15.5)
WBC: 5.3 10*3/uL (ref 4.0–10.5)
nRBC: 0 % (ref 0.0–0.2)

## 2020-05-16 LAB — BASIC METABOLIC PANEL
Anion gap: 8 (ref 5–15)
BUN: 11 mg/dL (ref 6–20)
CO2: 24 mmol/L (ref 22–32)
Calcium: 9 mg/dL (ref 8.9–10.3)
Chloride: 106 mmol/L (ref 98–111)
Creatinine, Ser: 0.64 mg/dL (ref 0.44–1.00)
GFR, Estimated: >60 mL/min (ref >60)
Glucose, Bld: 85 mg/dL (ref 70–99)
Potassium: 3.7 mmol/L (ref 3.5–5.1)
Sodium: 138 mmol/L (ref 135–145)

## 2020-05-16 LAB — SARS CORONAVIRUS 2 (TAT 6-24 HRS): SARS Coronavirus 2: NEGATIVE

## 2020-05-19 NOTE — H&P (Signed)
Caroline Jordan is an 35 y.o. female. She was seen in January for menses being longer and heavier than normal-lasting up to 11 days, passing some clots.  Requiring PO iron for anemia.  Pelvic ultrasound with 4 measurable myomas up to 3.3 cm, mostly intramural with some possible submucosal component.  All options discussed, she wants definitive surgical management  Pertinent Gynecological History: Last pap: normal Date: 2018 OB History: G2, P1011 SVD   Menstrual History: Patient's last menstrual period was 04/30/2020.    Past Medical History:  Diagnosis Date  . Anemia   . History of trichomoniasis   . HSV infection   . HTN (hypertension)   . Menorrhagia   . Pregnancy induced hypertension   . Tachycardia    NO ISSUES SINCE MAY 2021 per pt  . Wears glasses     Past Surgical History:  Procedure Laterality Date  . BUNION REMOVED FROM TOE  10/10/2019   LEFT GREAT TOE  . EXTERNAL EAR SURGERY  AS CHILD   keloids    Family History  Problem Relation Age of Onset  . Hypertension Father   . Hypertension Sister   . Heart attack Sister   . Healthy Brother   . Mental illness Sister        BULIMIC  . Healthy Sister     Social History:  reports that she has never smoked. She has never used smokeless tobacco. She reports that she does not drink alcohol and does not use drugs.  Allergies: No Known Allergies  No medications prior to admission.    Review of Systems  Respiratory: Negative.   Cardiovascular: Negative.     Height 4\' 11"  (1.499 m), weight 82.1 kg, last menstrual period 04/30/2020, unknown if currently breastfeeding. Physical Exam Constitutional:      Appearance: Normal appearance.  Cardiovascular:     Rate and Rhythm: Normal rate and regular rhythm.     Heart sounds: Normal heart sounds. No murmur heard.   Pulmonary:     Effort: Pulmonary effort is normal. No respiratory distress.     Breath sounds: Normal breath sounds. No wheezing.  Abdominal:      General: There is no distension.     Palpations: Abdomen is soft. There is no mass.     Tenderness: There is no abdominal tenderness.  Genitourinary:    Comments: Uterus slightly enlarged, irregular No adnexal mass Musculoskeletal:     Cervical back: Normal range of motion and neck supple.  Neurological:     Mental Status: She is alert.     No results found for this or any previous visit (from the past 24 hour(s)).  No results found.  Assessment/Plan: Symptomatic myomatous uterus with long, heavy menses and anemia.  All medical and surgical options have been discussed.  Surgical procedure, options, risks, chances of relieving her symptoms have been discussed, questions answered.  Will admit for [facility], possible bilateral salpingectomy, cystoscopy  Blane Ohara Toluwani Yadav 05/19/2020, 12:32 PM

## 2020-05-19 NOTE — Anesthesia Preprocedure Evaluation (Addendum)
Anesthesia Evaluation  Patient identified by MRN, date of birth, ID band Patient awake    Reviewed: Allergy & Precautions, H&P , NPO status , Patient's Chart, lab work & pertinent test results  History of Anesthesia Complications Negative for: history of anesthetic complications  Airway Mallampati: II  TM Distance: >3 FB Neck ROM: Full    Dental  (+) Dental Advisory Given, Teeth Intact   Pulmonary neg pulmonary ROS,    Pulmonary exam normal breath sounds clear to auscultation       Cardiovascular hypertension, Pt. on medications Normal cardiovascular exam Rhythm:Regular Rate:Normal     Neuro/Psych negative neurological ROS  negative psych ROS   GI/Hepatic negative GI ROS, Neg liver ROS,   Endo/Other  Morbid obesity  Renal/GU negative Renal ROS     Musculoskeletal   Abdominal (+) + obese,   Peds  Hematology  (+) anemia ,   Anesthesia Other Findings   Reproductive/Obstetrics (+) Pregnancy                            Anesthesia Physical  Anesthesia Plan  ASA: II  Anesthesia Plan: General   Post-op Pain Management:    Induction: Intravenous  PONV Risk Score and Plan: 4 or greater and Ondansetron, Dexamethasone, Midazolam, Scopolamine patch - Pre-op, Diphenhydramine and Treatment may vary due to age or medical condition  Airway Management Planned: Oral ETT  Additional Equipment: None  Intra-op Plan:   Post-operative Plan: Extubation in OR  Informed Consent: I have reviewed the patients History and Physical, chart, labs and discussed the procedure including the risks, benefits and alternatives for the proposed anesthesia with the patient or authorized representative who has indicated his/her understanding and acceptance.     Dental advisory given  Plan Discussed with: CRNA  Anesthesia Plan Comments:        Anesthesia Quick Evaluation

## 2020-05-20 ENCOUNTER — Ambulatory Visit (HOSPITAL_BASED_OUTPATIENT_CLINIC_OR_DEPARTMENT_OTHER)
Admission: RE | Admit: 2020-05-20 | Discharge: 2020-05-20 | Disposition: A | Discharge status: Home / Self Care | Payer: 59 | Attending: Obstetrics and Gynecology | Admitting: Obstetrics and Gynecology

## 2020-05-20 ENCOUNTER — Encounter (HOSPITAL_BASED_OUTPATIENT_CLINIC_OR_DEPARTMENT_OTHER)
Admission: RE | Disposition: A | Discharge status: Home / Self Care | Payer: Self-pay | Source: Home / Self Care | Attending: Obstetrics and Gynecology

## 2020-05-20 ENCOUNTER — Ambulatory Visit (HOSPITAL_BASED_OUTPATIENT_CLINIC_OR_DEPARTMENT_OTHER): Payer: 59 | Admitting: Anesthesiology

## 2020-05-20 ENCOUNTER — Other Ambulatory Visit: Payer: Self-pay

## 2020-05-20 ENCOUNTER — Encounter (HOSPITAL_BASED_OUTPATIENT_CLINIC_OR_DEPARTMENT_OTHER): Payer: Self-pay | Admitting: Obstetrics and Gynecology

## 2020-05-20 DIAGNOSIS — Z8249 Family history of ischemic heart disease and other diseases of the circulatory system: Secondary | ICD-10-CM | POA: Insufficient documentation

## 2020-05-20 DIAGNOSIS — N72 Inflammatory disease of cervix uteri: Secondary | ICD-10-CM | POA: Diagnosis not present

## 2020-05-20 DIAGNOSIS — D259 Leiomyoma of uterus, unspecified: Secondary | ICD-10-CM | POA: Insufficient documentation

## 2020-05-20 DIAGNOSIS — I10 Essential (primary) hypertension: Secondary | ICD-10-CM | POA: Diagnosis not present

## 2020-05-20 DIAGNOSIS — N838 Other noninflammatory disorders of ovary, fallopian tube and broad ligament: Secondary | ICD-10-CM | POA: Diagnosis not present

## 2020-05-20 DIAGNOSIS — N920 Excessive and frequent menstruation with regular cycle: Secondary | ICD-10-CM | POA: Diagnosis present

## 2020-05-20 DIAGNOSIS — D649 Anemia, unspecified: Secondary | ICD-10-CM | POA: Diagnosis not present

## 2020-05-20 HISTORY — PX: CYSTOSCOPY: SHX5120

## 2020-05-20 HISTORY — DX: Presence of spectacles and contact lenses: Z97.3

## 2020-05-20 HISTORY — PX: VAGINAL HYSTERECTOMY: SHX2639

## 2020-05-20 HISTORY — DX: Excessive and frequent menstruation with regular cycle: N92.0

## 2020-05-20 LAB — TYPE AND SCREEN
ABO/RH(D): O POS
Antibody Screen: NEGATIVE

## 2020-05-20 LAB — POCT PREGNANCY, URINE: Preg Test, Ur: NEGATIVE

## 2020-05-20 SURGERY — HYSTERECTOMY, VAGINAL
Anesthesia: General | Site: Vagina

## 2020-05-20 MED ORDER — ACETAMINOPHEN 500 MG PO TABS
1000.0000 mg | ORAL_TABLET | Freq: Four times a day (QID) | ORAL | Status: DC
  Administered 2020-05-20: 1000 mg via ORAL

## 2020-05-20 MED ORDER — LACTATED RINGERS IV SOLN: INTRAVENOUS | Status: DC

## 2020-05-20 MED ORDER — BISACODYL 10 MG RE SUPP: 10.0000 mg | Freq: Every day | RECTAL | Status: DC | PRN

## 2020-05-20 MED ORDER — FENTANYL CITRATE (PF) 100 MCG/2ML IJ SOLN
INTRAMUSCULAR | Status: DC | PRN
  Administered 2020-05-20: 50 ug via INTRAVENOUS
  Administered 2020-05-20: 100 ug via INTRAVENOUS

## 2020-05-20 MED ORDER — MENTHOL 3 MG MT LOZG: 1.0000 | LOZENGE | OROMUCOSAL | Status: DC | PRN

## 2020-05-20 MED ORDER — ROCURONIUM BROMIDE 10 MG/ML (PF) SYRINGE
PREFILLED_SYRINGE | INTRAVENOUS | Status: DC | PRN
  Administered 2020-05-20: 100 mg via INTRAVENOUS

## 2020-05-20 MED ORDER — OXYCODONE HCL 5 MG PO TABS: 5.0000 mg | ORAL_TABLET | ORAL | Status: DC | PRN

## 2020-05-20 MED ORDER — ONDANSETRON HCL 4 MG/2ML IJ SOLN: 4.0000 mg | Freq: Four times a day (QID) | INTRAMUSCULAR | Status: DC | PRN

## 2020-05-20 MED ORDER — SIMETHICONE 80 MG PO CHEW: 80.0000 mg | CHEWABLE_TABLET | Freq: Four times a day (QID) | ORAL | Status: DC | PRN

## 2020-05-20 MED ORDER — MIDAZOLAM HCL 5 MG/5ML IJ SOLN
INTRAMUSCULAR | Status: DC | PRN
  Administered 2020-05-20: 2 mg via INTRAVENOUS

## 2020-05-20 MED ORDER — IBUPROFEN 600 MG PO TABS: 600.0000 mg | ORAL_TABLET | Freq: Four times a day (QID) | ORAL | 0 refills | Status: AC

## 2020-05-20 MED ORDER — OXYCODONE HCL 5 MG/5ML PO SOLN: 5.0000 mg | Freq: Once | ORAL | Status: DC | PRN

## 2020-05-20 MED ORDER — KETOROLAC TROMETHAMINE 30 MG/ML IJ SOLN
30.0000 mg | Freq: Four times a day (QID) | INTRAMUSCULAR | Status: DC
  Administered 2020-05-20: 30 mg via INTRAVENOUS

## 2020-05-20 MED ORDER — SCOPOLAMINE 1 MG/3DAYS TD PT72
MEDICATED_PATCH | TRANSDERMAL | Status: AC
  Filled 2020-05-20: qty 1

## 2020-05-20 MED ORDER — GABAPENTIN 300 MG PO CAPS
300.0000 mg | ORAL_CAPSULE | Freq: Three times a day (TID) | ORAL | Status: DC
  Administered 2020-05-20: 300 mg via ORAL

## 2020-05-20 MED ORDER — DEXAMETHASONE SODIUM PHOSPHATE 10 MG/ML IJ SOLN
INTRAMUSCULAR | Status: DC | PRN
  Administered 2020-05-20: 10 mg via INTRAVENOUS

## 2020-05-20 MED ORDER — GABAPENTIN 300 MG PO CAPS: 300.0000 mg | ORAL_CAPSULE | Freq: Three times a day (TID) | ORAL | 0 refills | Status: AC

## 2020-05-20 MED ORDER — BUPIVACAINE HCL (PF) 0.5 % IJ SOLN
INTRAMUSCULAR | Status: DC | PRN
  Administered 2020-05-20: 12 mL

## 2020-05-20 MED ORDER — PHENYLEPHRINE HCL (PRESSORS) 10 MG/ML IV SOLN
INTRAVENOUS | Status: DC | PRN
  Administered 2020-05-20 (×4): 80 ug via INTRAVENOUS

## 2020-05-20 MED ORDER — SUGAMMADEX SODIUM 200 MG/2ML IV SOLN
INTRAVENOUS | Status: DC | PRN
  Administered 2020-05-20: 200 mg via INTRAVENOUS

## 2020-05-20 MED ORDER — KETOROLAC TROMETHAMINE 30 MG/ML IJ SOLN
INTRAMUSCULAR | Status: AC
  Filled 2020-05-20: qty 1

## 2020-05-20 MED ORDER — LIDOCAINE 2% (20 MG/ML) 5 ML SYRINGE
INTRAMUSCULAR | Status: AC
  Filled 2020-05-20: qty 5

## 2020-05-20 MED ORDER — ONDANSETRON HCL 4 MG PO TABS: 4.0000 mg | ORAL_TABLET | Freq: Four times a day (QID) | ORAL | Status: DC | PRN

## 2020-05-20 MED ORDER — IBUPROFEN 200 MG PO TABS: 600.0000 mg | ORAL_TABLET | Freq: Four times a day (QID) | ORAL | Status: DC

## 2020-05-20 MED ORDER — OXYCODONE HCL 5 MG PO TABS: 5.0000 mg | ORAL_TABLET | ORAL | 0 refills | Status: AC | PRN

## 2020-05-20 MED ORDER — DIPHENHYDRAMINE HCL 50 MG/ML IJ SOLN
INTRAMUSCULAR | Status: DC | PRN
  Administered 2020-05-20: 12.5 mg via INTRAVENOUS

## 2020-05-20 MED ORDER — DEXTROSE-NACL 5-0.45 % IV SOLN: INTRAVENOUS | Status: DC

## 2020-05-20 MED ORDER — ARTIFICIAL TEARS OPHTHALMIC OINT
TOPICAL_OINTMENT | OPHTHALMIC | Status: AC
  Filled 2020-05-20: qty 3.5

## 2020-05-20 MED ORDER — PROPOFOL 10 MG/ML IV BOLUS
INTRAVENOUS | Status: DC | PRN
  Administered 2020-05-20: 200 mg via INTRAVENOUS

## 2020-05-20 MED ORDER — CEFAZOLIN SODIUM-DEXTROSE 2-4 GM/100ML-% IV SOLN
INTRAVENOUS | Status: AC
  Filled 2020-05-20: qty 100

## 2020-05-20 MED ORDER — ONDANSETRON HCL 4 MG/2ML IJ SOLN
INTRAMUSCULAR | Status: DC | PRN
  Administered 2020-05-20: 4 mg via INTRAVENOUS

## 2020-05-20 MED ORDER — OXYCODONE HCL 5 MG PO TABS: 5.0000 mg | ORAL_TABLET | Freq: Once | ORAL | Status: DC | PRN

## 2020-05-20 MED ORDER — KETOROLAC TROMETHAMINE 30 MG/ML IJ SOLN
INTRAMUSCULAR | Status: DC | PRN
  Administered 2020-05-20: 30 mg via INTRAVENOUS

## 2020-05-20 MED ORDER — HYDROMORPHONE HCL 1 MG/ML IJ SOLN
INTRAMUSCULAR | Status: AC
  Filled 2020-05-20: qty 1

## 2020-05-20 MED ORDER — ACETAMINOPHEN 500 MG PO TABS
1000.0000 mg | ORAL_TABLET | Freq: Once | ORAL | Status: AC
  Administered 2020-05-20: 1000 mg via ORAL

## 2020-05-20 MED ORDER — GABAPENTIN 300 MG PO CAPS
ORAL_CAPSULE | ORAL | Status: AC
  Filled 2020-05-20: qty 1

## 2020-05-20 MED ORDER — HYDROMORPHONE HCL 1 MG/ML IJ SOLN: 1.0000 mg | INTRAMUSCULAR | Status: DC | PRN

## 2020-05-20 MED ORDER — SCOPOLAMINE 1 MG/3DAYS TD PT72
1.0000 | MEDICATED_PATCH | TRANSDERMAL | Status: DC
  Administered 2020-05-20: 1.5 mg via TRANSDERMAL

## 2020-05-20 MED ORDER — PROPOFOL 10 MG/ML IV BOLUS
INTRAVENOUS | Status: AC
  Filled 2020-05-20: qty 20

## 2020-05-20 MED ORDER — VASOPRESSIN 20 UNIT/ML IV SOLN
INTRAVENOUS | Status: DC | PRN
  Administered 2020-05-20: 18 mL via INTRAMUSCULAR

## 2020-05-20 MED ORDER — KETOROLAC TROMETHAMINE 30 MG/ML IJ SOLN: 30.0000 mg | Freq: Once | INTRAMUSCULAR | Status: DC

## 2020-05-20 MED ORDER — MIDAZOLAM HCL 2 MG/2ML IJ SOLN
INTRAMUSCULAR | Status: AC
  Filled 2020-05-20: qty 2

## 2020-05-20 MED ORDER — HYDROCHLOROTHIAZIDE 12.5 MG PO CAPS: 12.5000 mg | ORAL_CAPSULE | Freq: Every day | ORAL | Status: DC

## 2020-05-20 MED ORDER — LIDOCAINE 2% (20 MG/ML) 5 ML SYRINGE
INTRAMUSCULAR | Status: DC | PRN
  Administered 2020-05-20: 60 mg via INTRAVENOUS

## 2020-05-20 MED ORDER — ACETAMINOPHEN 500 MG PO TABS
ORAL_TABLET | ORAL | Status: AC
  Filled 2020-05-20: qty 2

## 2020-05-20 MED ORDER — DEXAMETHASONE SODIUM PHOSPHATE 10 MG/ML IJ SOLN
INTRAMUSCULAR | Status: AC
  Filled 2020-05-20: qty 1

## 2020-05-20 MED ORDER — DIPHENHYDRAMINE HCL 50 MG/ML IJ SOLN
INTRAMUSCULAR | Status: AC
  Filled 2020-05-20: qty 1

## 2020-05-20 MED ORDER — KETOROLAC TROMETHAMINE 30 MG/ML IJ SOLN: 30.0000 mg | Freq: Once | INTRAMUSCULAR | Status: DC | PRN

## 2020-05-20 MED ORDER — STERILE WATER FOR IRRIGATION IR SOLN
Status: DC | PRN
  Administered 2020-05-20: 3000 mL via INTRAVESICAL

## 2020-05-20 MED ORDER — ONDANSETRON HCL 4 MG/2ML IJ SOLN
INTRAMUSCULAR | Status: AC
  Filled 2020-05-20: qty 2

## 2020-05-20 MED ORDER — CEFAZOLIN SODIUM-DEXTROSE 2-4 GM/100ML-% IV SOLN
2.0000 g | INTRAVENOUS | Status: AC
  Administered 2020-05-20: 2 g via INTRAVENOUS

## 2020-05-20 MED ORDER — ROCURONIUM BROMIDE 10 MG/ML (PF) SYRINGE
PREFILLED_SYRINGE | INTRAVENOUS | Status: AC
  Filled 2020-05-20: qty 10

## 2020-05-20 MED ORDER — PHENYLEPHRINE 40 MCG/ML (10ML) SYRINGE FOR IV PUSH (FOR BLOOD PRESSURE SUPPORT)
PREFILLED_SYRINGE | INTRAVENOUS | Status: AC
  Filled 2020-05-20: qty 10

## 2020-05-20 MED ORDER — HYDROMORPHONE HCL 1 MG/ML IJ SOLN
0.2500 mg | INTRAMUSCULAR | Status: DC | PRN
  Administered 2020-05-20 (×2): 0.25 mg via INTRAVENOUS

## 2020-05-20 MED ORDER — FENTANYL CITRATE (PF) 250 MCG/5ML IJ SOLN
INTRAMUSCULAR | Status: AC
  Filled 2020-05-20: qty 5

## 2020-05-20 MED ORDER — ALUM & MAG HYDROXIDE-SIMETH 200-200-20 MG/5ML PO SUSP: 30.0000 mL | ORAL | Status: DC | PRN

## 2020-05-20 SURGICAL SUPPLY — 29 items
BLADE SURG 15 STRL LF DISP TIS (BLADE) IMPLANT
BLADE SURG 15 STRL SS (BLADE)
CANISTER SUCT 3000ML PPV (MISCELLANEOUS) ×3 IMPLANT
CATH ROBINSON RED A/P 16FR (CATHETERS) IMPLANT
COVER WAND RF STERILE (DRAPES) ×3 IMPLANT
DECANTER SPIKE VIAL GLASS SM (MISCELLANEOUS) ×3 IMPLANT
GAUZE 4X4 16PLY RFD (DISPOSABLE) ×3 IMPLANT
GLOVE ORTHO TXT STRL SZ7.5 (GLOVE) ×3 IMPLANT
GLOVE SRG 8 PF TXTR STRL LF DI (GLOVE) ×2 IMPLANT
GLOVE SURG UNDER POLY LF SZ8 (GLOVE) ×3
GOWN STRL REUS W/TWL XL LVL3 (GOWN DISPOSABLE) ×3 IMPLANT
KIT TURNOVER CYSTO (KITS) ×3 IMPLANT
NS IRRIG 500ML POUR BTL (IV SOLUTION) ×3 IMPLANT
PACK TRENDGUARD 450 HYBRID PRO (MISCELLANEOUS) ×2 IMPLANT
PACK VAGINAL WOMENS (CUSTOM PROCEDURE TRAY) ×3 IMPLANT
PAD OB MATERNITY 4.3X12.25 (PERSONAL CARE ITEMS) ×3 IMPLANT
SET IRRIG Y TYPE TUR BLADDER L (SET/KITS/TRAYS/PACK) ×3 IMPLANT
SHEARS FOC LG CVD HARMONIC 17C (MISCELLANEOUS) ×3 IMPLANT
SUT CHROMIC 1 TIES 18 (SUTURE) IMPLANT
SUT CHROMIC 1MO 4 18 CR8 (SUTURE) ×3 IMPLANT
SUT SILK 2 0 SH (SUTURE) ×3 IMPLANT
SUT VIC AB 2-0 CT1 (SUTURE) IMPLANT
SUT VIC AB 2-0 CT1 27 (SUTURE) ×3
SUT VIC AB 2-0 CT1 TAPERPNT 27 (SUTURE) ×2 IMPLANT
SUT VIC AB 3-0 SH 27 (SUTURE)
SUT VIC AB 3-0 SH 27X BRD (SUTURE) IMPLANT
TOWEL OR 17X26 10 PK STRL BLUE (TOWEL DISPOSABLE) ×6 IMPLANT
TRAY FOLEY W/BAG SLVR 14FR LF (SET/KITS/TRAYS/PACK) ×3 IMPLANT
TRENDGUARD 450 HYBRID PRO PACK (MISCELLANEOUS) ×3

## 2020-05-20 NOTE — Anesthesia Procedure Notes (Signed)
Procedure Name: Intubation Date/Time: 05/20/2020 7:29 AM Performed by: Rogers Blocker, CRNA Pre-anesthesia Checklist: Patient identified, Emergency Drugs available, Suction available and Patient being monitored Patient Re-evaluated:Patient Re-evaluated prior to induction Oxygen Delivery Method: Circle System Utilized Preoxygenation: Pre-oxygenation with 100% oxygen Induction Type: IV induction Ventilation: Mask ventilation without difficulty Laryngoscope Size: Mac and 3 Grade View: Grade I Tube type: Oral Number of attempts: 1 Airway Equipment and Method: Stylet Placement Confirmation: ETT inserted through vocal cords under direct vision,  positive ETCO2 and breath sounds checked- equal and bilateral Secured at: 22 cm Tube secured with: Tape Dental Injury: Teeth and Oropharynx as per pre-operative assessment

## 2020-05-20 NOTE — Interval H&P Note (Signed)
History and Physical Interval Note:  05/20/2020 7:18 AM  Caroline Jordan  has presented today for surgery, with the diagnosis of menorrhagia, myomas.  The various methods of treatment have been discussed with the patient and family. After consideration of risks, benefits and other options for treatment, the patient has consented to  Procedure(s): HYSTERECTOMY VAGINAL POSSIBLE BILATERAL SALPINGECTOMY (Bilateral) CYSTOSCOPY (N/A) as a surgical intervention.  The patient's history has been reviewed, patient examined, no change in status, stable for surgery.  I have reviewed the patient's chart and labs.  Questions were answered to the patient's satisfaction.     Blane Ohara Betina Puckett

## 2020-05-20 NOTE — Op Note (Signed)
Preoperative diagnosis: Menorrhagia, myomatous uterus Postop diagnosis: Same Procedure: Total vaginal hysterectomy, bilateral salpingectomy and cystoscopy Surgeon: Cheri Fowler M.D. Assistant: Paula Compton M.D. Anesthesia: Gen. Findings: Patient had a slightly enlarged uterus with normal tubes and ovaries. Via cystoscopy bladder was normal and both ureters were patent.  Assistance from Dr. Marvel Plan was necessary throughout the case to help expose pedicles and control bleeding Estimated blood loss: 200 cc Specimens: Uterus and tubes for routine pathology Complications: None   Procedure in detail: The patient was taken to the operating room and placed in the dorsosupine position. General anesthesia was induced and she was placed in mobile stirrups. Legs were elevated in the stirrups. Perineum and vagina were then prepped and draped in the usual sterile fashion, bladder drained with red Robinson catheter. A Graves speculum was inserted in the vagina and the cervix was grasped with Ardis Hughs tenaculum. Dilute Pitressin with Marcaine was then instilled at the cervicovaginal junction which was then incised circumferentially with electrocautery. Sharp dissection was then used to further free the vagina from the cervix. Anterior peritoneum was identified and entered sharply. A Deaver retractor was used to retract the bladder anteriorly. Posterior cul-de-sac was identified and entered sharply. A Bonnano speculum was placed into the posterior cul-de-sac. Uterosacral ligaments were clamped transected and ligated with #1 chromic and tagged for later use. The remaining pedicles were then taken down with harmonic scalpel.  When just the tubes were still attached, the uterus was delivered posteriorly and both tubes were identified and traced to their fimbria.  Harmonic scalpel was the used to free the tubes and any remaining attachments of the the uterus, the uterus and tubes were removed. Ovaries were inspected  and found to be normal. A small amount of bleeding was controlled with the Hormonic scalpel.  Bleeding at each uterosacral pedicle was controlled with figure 8 sutures of #1 Chromic. The uterosacral ligaments were then plicated in the midline with 2-0 silk after the Bonnano speculum was removed and a shorter vaginal speculum was placed. The previously tagged uterosacral pedicles were also tied in the midline. No significant bleeding was identified. The vagina was then closed in a vertical fashion with running locking 2-0 Vicryl with adequate closure and adequate hemostasis.   Attention was turned to cystoscopy.  A 70 cystoscope was inserted and 200 cc of fluid was instilled. The bladder appeared normal. Both ureteral orifices were easily identified and urine was seen to flow freely from each orifice. The cystoscope was removed and the bladder was drained with a red robinson catheter. The patient was taken down from stirrups. She was awakened in the operating room and taken to the recovery room in stable condition after tolerating the procedure well. Counts were correct, she had PAS hose on throughout the procedure, she received Ancef preop.

## 2020-05-20 NOTE — Discharge Instructions (Signed)
Routine instructions for vaginal hysterectomy   Vaginal Hysterectomy, Care After The following information offers guidance on how to care for yourself after your procedure. Your health care provider may also give you more specific instructions. If you have problems or questions, contact your health care provider. What can I expect after the procedure? After the procedure, it is common to have:  Pain in the lower abdomen and vagina.  Vaginal bleeding and discharge for up to 1 week. You will need to use a sanitary pad after this procedure.  Difficulty having a bowel movement (constipation).  Temporary problems emptying the bladder.  Tiredness (fatigue).  Poor appetite.  Less interest in sex.  Feelings of sadness or other emotions. If your ovaries were also removed, it is also common to have symptoms of menopause, such as hot flashes, night sweats, and lack of sleep (insomnia). Follow these instructions at home: Medicines  Take over-the-counter and prescription medicines only as told by your health care provider.  Do not take aspirin or NSAIDs, such as ibuprofen. These medicines can cause bleeding.  Ask your health care provider if the medicine prescribed to you: ? Requires you to avoid driving or using machinery. ? Can cause constipation. You may need to take these actions to prevent or treat constipation:  Drink enough fluid to keep your urine pale yellow.  Take over-the-counter or prescription medicines.  Eat foods that are high in fiber, such as beans, whole grains, and fresh fruits and vegetables.  Limit foods that are high in fat and processed sugars, such as fried or sweet foods.   Activity  Rest as told by your health care provider.  Return to your normal activities as told by your health care provider. Ask your health care provider what activities are safe for you  Avoid sitting for a long time without moving. Get up to take short walks every 1-2 hours. This is  important to improve blood flow and breathing. Ask for help if you feel weak or unsteady.  Try to have someone home with you for 1-2 weeks to help you with everyday chores.  Do not lift anything that is heavier than 10 lb (4.5 kg), or the limit that you are told, until your health care provider says that it is safe.  If you were given a sedative during the procedure, it can affect you for several hours. Do not drive or operate machinery until your health care provider says that it is safe.   Lifestyle  Do not use any products that contain nicotine or tobacco. These products include cigarettes, chewing tobacco, and vaping devices, such as e-cigarettes. These can delay healing after surgery. If you need help quitting, ask your health care provider.  Do not drink alcohol until your health care provider approves. General instructions  Do not douche, use tampons, or have sex for at least 6 weeks, or as told by your health care provider.  If you struggle with physical or emotional changes after your procedure, speak with your health care provider or a therapist.  The stitches inside your vagina will dissolve over time and do not need to be taken out.  Do not take baths, swim, or use a hot tub until your health care provider approves. You may only be allowed to take showers for 2-3 weeks  Wear compression stockings as told by your health care provider. These stockings help to prevent blood clots and reduce swelling in your legs.  Keep all follow-up visits. This is important.  Contact a health care provider if:  Your pain medicine is not helping.  You have a fever.  You have nausea or vomiting that does not go away.  You feel dizzy.  You have blood, pus, or a bad-smelling discharge from your vagina more than 1 week after the procedure.  You continue to have trouble urinating 3-5 days after the procedure. Get help right away if:  You have severe pain in your abdomen or back.  You  faint.  You have heavy vaginal bleeding and blood clots, soaking through a sanitary pad in less than 1 hour.  You have chest pain or shortness of breath.  You have pain, swelling, or redness in your leg. These symptoms may represent a serious problem that is an emergency. Do not wait to see if the symptoms will go away. Get medical help right away. Call your local emergency services (911 in the [place] Do not drive yourself to the hospital. Summary  After the procedure, it is common to have pain, vaginal bleeding, constipation, temporary problems emptying your bladder, and feelings of sadness or other emotions.  Take over-the-counter and prescription medicines only as told by your health care provider.  Rest as told by your health care provider. Return to your normal activities as told by your health care provider.  Contact a health care provider if your pain medicine is not helping, or you have a fever, dizziness, or trouble urinating several days after the procedure.  Get help right away if you have severe pain in your abdomen or back, or if you faint, have heavy bleeding, or have chest pain or shortness of breath. This information is not intended to replace advice given to you by your health care provider. Make sure you discuss any questions you have with your health care provider. Document Revised: 09/14/2019 Document Reviewed: 09/14/2019 Elsevier Patient Education  2021 [place] Instructions  Activity: Get plenty of rest for the remainder of the day. A responsible individual must stay with you for 24 hours following the procedure.  For the next 24 hours, DO NOT: -Drive a car -Paediatric nurse -Drink alcoholic beverages -Take any medication unless instructed by your physician -Make any legal decisions or sign important papers.  Meals: Start with liquid foods such as gelatin or soup. Progress to regular foods as tolerated. Avoid greasy, spicy,  heavy foods. If nausea and/or vomiting occur, drink only clear liquids until the nausea and/or vomiting subsides. Call your physician if vomiting continues.  Special Instructions/Symptoms: Your throat may feel dry or sore from the anesthesia or the breathing tube placed in your throat during surgery. If this causes discomfort, gargle with warm salt water. The discomfort should disappear within 24 hours.  If you had a scopolamine patch placed behind your ear for the management of post- operative nausea and/or vomiting:  1. The medication in the patch is effective for 72 hours, after which it should be removed.  Wrap patch in a tissue and discard in the trash. Wash hands thoroughly with soap and water. 2. You may remove the patch earlier than 72 hours if you experience unpleasant side effects which may include dry mouth, dizziness or visual disturbances. 3. Avoid touching the patch. Wash your hands with soap and water after contact with the patch.

## 2020-05-20 NOTE — Progress Notes (Signed)
Post-op check, s/p TVH Doing well, minimal pain so far, voiding, ambulating, no n/v Afeb, VSS Abd- soft D/c home

## 2020-05-20 NOTE — Transfer of Care (Signed)
Immediate Anesthesia Transfer of Care Note  Patient: Caroline Jordan  Procedure(s) Performed: Procedure(s) (LRB): HYSTERECTOMY VAGINAL , BILATERAL SALPINGECTOMY (Bilateral) CYSTOSCOPY (N/A)  Patient Location: PACU  Anesthesia Type: General  Level of Consciousness: awake, alert  and oriented  Airway & Oxygen Therapy: Patient Spontanous Breathing and Patient connected to face mask oxygen  Post-op Assessment: Report given to PACU RN and Post -op Vital signs reviewed and stable  Post vital signs: Reviewed and stable  Complications: No apparent anesthesia complications  Last Vitals:  Vitals Value Taken Time  BP 106/61 05/20/20 0841  Temp    Pulse 84 05/20/20 0843  Resp 19 05/20/20 0843  SpO2 100 % 05/20/20 0843  Vitals shown include unvalidated device data.  Last Pain:  Vitals:   05/20/20 0603  TempSrc: Oral  PainSc: 0-No pain      Patients Stated Pain Goal: 4 (70/35/00 9381)  Complications: No complications documented.

## 2020-05-21 LAB — SURGICAL PATHOLOGY

## 2020-05-21 NOTE — Anesthesia Postprocedure Evaluation (Signed)
Anesthesia Post Note  Patient: Caroline Jordan  Procedure(s) Performed: HYSTERECTOMY VAGINAL , BILATERAL SALPINGECTOMY (Bilateral Vagina ) CYSTOSCOPY (N/A Bladder)     Patient location during evaluation: PACU Anesthesia Type: General Level of consciousness: sedated and patient cooperative Pain management: pain level controlled Vital Signs Assessment: post-procedure vital signs reviewed and stable Respiratory status: spontaneous breathing Cardiovascular status: stable Anesthetic complications: no   No complications documented.  Last Vitals:  Vitals:   05/20/20 1130 05/20/20 1645  BP: 124/82 121/78  Pulse: 70 67  Resp: 16 16  Temp: 36.8 C 37.1 C  SpO2: 100% 100%    Last Pain:  Vitals:   05/20/20 1645  TempSrc:   PainSc: 0-No pain                 Nolon Nations

## 2020-05-22 ENCOUNTER — Encounter (HOSPITAL_BASED_OUTPATIENT_CLINIC_OR_DEPARTMENT_OTHER): Payer: Self-pay | Admitting: Obstetrics and Gynecology

## 2020-10-09 DIAGNOSIS — B373 Candidiasis of vulva and vagina: Secondary | ICD-10-CM | POA: Diagnosis not present

## 2020-10-09 DIAGNOSIS — R61 Generalized hyperhidrosis: Secondary | ICD-10-CM | POA: Diagnosis not present

## 2020-10-09 DIAGNOSIS — N898 Other specified noninflammatory disorders of vagina: Secondary | ICD-10-CM | POA: Diagnosis not present

## 2020-10-13 DIAGNOSIS — R61 Generalized hyperhidrosis: Secondary | ICD-10-CM | POA: Diagnosis not present

## 2021-06-24 DIAGNOSIS — Z01419 Encounter for gynecological examination (general) (routine) without abnormal findings: Secondary | ICD-10-CM | POA: Diagnosis not present

## 2021-06-24 DIAGNOSIS — Z113 Encounter for screening for infections with a predominantly sexual mode of transmission: Secondary | ICD-10-CM | POA: Diagnosis not present

## 2021-06-24 DIAGNOSIS — N760 Acute vaginitis: Secondary | ICD-10-CM | POA: Diagnosis not present

## 2021-06-24 DIAGNOSIS — B3731 Acute candidiasis of vulva and vagina: Secondary | ICD-10-CM | POA: Diagnosis not present

## 2021-06-24 DIAGNOSIS — N898 Other specified noninflammatory disorders of vagina: Secondary | ICD-10-CM | POA: Diagnosis not present

## 2021-06-24 DIAGNOSIS — Z130 Encounter for screening for diseases of the blood and blood-forming organs and certain disorders involving the immune mechanism: Secondary | ICD-10-CM | POA: Diagnosis not present

## 2022-02-12 DIAGNOSIS — M7989 Other specified soft tissue disorders: Secondary | ICD-10-CM | POA: Diagnosis not present

## 2022-02-12 DIAGNOSIS — I10 Essential (primary) hypertension: Secondary | ICD-10-CM | POA: Diagnosis not present

## 2022-02-12 DIAGNOSIS — Z0000 Encounter for general adult medical examination without abnormal findings: Secondary | ICD-10-CM | POA: Diagnosis not present

## 2022-02-23 ENCOUNTER — Other Ambulatory Visit: Payer: Self-pay | Admitting: Family Medicine

## 2022-02-23 DIAGNOSIS — M7989 Other specified soft tissue disorders: Secondary | ICD-10-CM

## 2022-03-17 ENCOUNTER — Other Ambulatory Visit: Payer: 59

## 2022-03-30 ENCOUNTER — Other Ambulatory Visit: Payer: 59

## 2022-04-13 ENCOUNTER — Ambulatory Visit
Admission: RE | Admit: 2022-04-13 | Discharge: 2022-04-13 | Disposition: A | Discharge status: Home / Self Care | Payer: 59 | Source: Ambulatory Visit | Attending: Family Medicine | Admitting: Family Medicine

## 2022-04-13 ENCOUNTER — Ambulatory Visit
Admission: RE | Admit: 2022-04-13 | Discharge: 2022-04-13 | Disposition: A | Discharge status: Home / Self Care | Payer: BC Managed Care – PPO | Source: Ambulatory Visit | Attending: Family Medicine | Admitting: Family Medicine

## 2022-04-13 DIAGNOSIS — M7989 Other specified soft tissue disorders: Secondary | ICD-10-CM

## 2022-04-13 DIAGNOSIS — N644 Mastodynia: Secondary | ICD-10-CM | POA: Diagnosis not present

## 2022-04-13 DIAGNOSIS — R928 Other abnormal and inconclusive findings on diagnostic imaging of breast: Secondary | ICD-10-CM | POA: Diagnosis not present

## 2022-07-01 DIAGNOSIS — Z1389 Encounter for screening for other disorder: Secondary | ICD-10-CM | POA: Diagnosis not present

## 2022-07-01 DIAGNOSIS — Z01419 Encounter for gynecological examination (general) (routine) without abnormal findings: Secondary | ICD-10-CM | POA: Diagnosis not present

## 2022-07-01 DIAGNOSIS — Z130 Encounter for screening for diseases of the blood and blood-forming organs and certain disorders involving the immune mechanism: Secondary | ICD-10-CM | POA: Diagnosis not present

## 2022-07-01 DIAGNOSIS — Z113 Encounter for screening for infections with a predominantly sexual mode of transmission: Secondary | ICD-10-CM | POA: Diagnosis not present

## 2023-02-24 DIAGNOSIS — Z0000 Encounter for general adult medical examination without abnormal findings: Secondary | ICD-10-CM | POA: Diagnosis not present

## 2023-02-24 DIAGNOSIS — Z862 Personal history of diseases of the blood and blood-forming organs and certain disorders involving the immune mechanism: Secondary | ICD-10-CM | POA: Diagnosis not present

## 2023-02-24 DIAGNOSIS — E559 Vitamin D deficiency, unspecified: Secondary | ICD-10-CM | POA: Diagnosis not present

## 2023-02-24 DIAGNOSIS — E538 Deficiency of other specified B group vitamins: Secondary | ICD-10-CM | POA: Diagnosis not present

## 2023-02-24 DIAGNOSIS — I10 Essential (primary) hypertension: Secondary | ICD-10-CM | POA: Diagnosis not present

## 2023-02-24 DIAGNOSIS — Z13220 Encounter for screening for lipoid disorders: Secondary | ICD-10-CM | POA: Diagnosis not present

## 2023-03-17 ENCOUNTER — Other Ambulatory Visit: Payer: Self-pay

## 2023-03-17 ENCOUNTER — Emergency Department (HOSPITAL_BASED_OUTPATIENT_CLINIC_OR_DEPARTMENT_OTHER)
Admission: EM | Admit: 2023-03-17 | Discharge: 2023-03-17 | Disposition: A | Discharge status: Home / Self Care | Payer: BC Managed Care – PPO | Attending: Emergency Medicine | Admitting: Emergency Medicine

## 2023-03-17 ENCOUNTER — Encounter (HOSPITAL_BASED_OUTPATIENT_CLINIC_OR_DEPARTMENT_OTHER): Payer: Self-pay | Admitting: Emergency Medicine

## 2023-03-17 ENCOUNTER — Emergency Department (HOSPITAL_BASED_OUTPATIENT_CLINIC_OR_DEPARTMENT_OTHER): Payer: BC Managed Care – PPO | Admitting: Radiology

## 2023-03-17 DIAGNOSIS — R42 Dizziness and giddiness: Secondary | ICD-10-CM | POA: Insufficient documentation

## 2023-03-17 DIAGNOSIS — I10 Essential (primary) hypertension: Secondary | ICD-10-CM | POA: Diagnosis not present

## 2023-03-17 DIAGNOSIS — R109 Unspecified abdominal pain: Secondary | ICD-10-CM | POA: Diagnosis not present

## 2023-03-17 DIAGNOSIS — E860 Dehydration: Secondary | ICD-10-CM | POA: Diagnosis not present

## 2023-03-17 DIAGNOSIS — R079 Chest pain, unspecified: Secondary | ICD-10-CM | POA: Diagnosis not present

## 2023-03-17 LAB — BASIC METABOLIC PANEL
Anion gap: 8 (ref 5–15)
BUN: 7 mg/dL (ref 6–20)
CO2: 25 mmol/L (ref 22–32)
Calcium: 8.7 mg/dL — ABNORMAL LOW (ref 8.9–10.3)
Chloride: 104 mmol/L (ref 98–111)
Creatinine, Ser: 0.78 mg/dL (ref 0.44–1.00)
GFR, Estimated: >60 mL/min (ref >60)
Glucose, Bld: 80 mg/dL (ref 70–99)
Potassium: 4 mmol/L (ref 3.5–5.1)
Sodium: 137 mmol/L (ref 135–145)

## 2023-03-17 LAB — CBC
HCT: 44.1 % (ref 36.0–46.0)
Hemoglobin: 14.1 g/dL (ref 12.0–15.0)
MCH: 26 pg (ref 26.0–34.0)
MCHC: 32 g/dL (ref 30.0–36.0)
MCV: 81.2 fL (ref 80.0–100.0)
Platelets: 318 10*3/uL (ref 150–400)
RBC: 5.43 MIL/uL — ABNORMAL HIGH (ref 3.87–5.11)
RDW: 13.4 % (ref 11.5–15.5)
WBC: 5.6 10*3/uL (ref 4.0–10.5)
nRBC: 0 % (ref 0.0–0.2)

## 2023-03-17 LAB — TROPONIN I (HIGH SENSITIVITY)
Troponin I (High Sensitivity): 2 ng/L (ref <18)
Troponin I (High Sensitivity): 2 ng/L (ref <18)

## 2023-03-17 NOTE — ED Notes (Signed)
 Dc instructions reviewed with patient. Patient voiced understanding. Dc with belongings.

## 2023-03-17 NOTE — Discharge Instructions (Signed)
You have been seen and discharged from the emergency department.  Your workup today was very reassuring.  Your blood work including heart enzymes and electrolytes were normal.  It is possible that mild dehydration contributed to your symptoms.  Stay well-hydrated with clear liquids.  Avoid dehydrating subsistence like tea, energy drinks, caffeine, alcohol, soda.  Follow-up with your primary provider for further evaluation and further care. Take home medications as prescribed. If you have any worsening symptoms or further concerns for your health please return to an emergency department for further evaluation.

## 2023-03-17 NOTE — ED Provider Notes (Signed)
[place] EMERGENCY DEPARTMENT AT [facility] Provider Note   CSN: [id number] Arrival date & time: 03/17/23  1127     History  Chief Complaint  Patient presents with   Dizziness    Reighan Hipolito is a 38 y.o. female.  HPI   38 year old female presents emergency department after an episode of dizziness. emergency department after an episode of dizziness.  Patient states that she has had these mild brief episodes of dizziness over the past 2 weeks, usually they are brief, self resolved.  She had episodes like this a while back when her potassium was low, she is currently on potassium supplements.  She denies any associated headache, neurosymptoms, chest pain, shortness of breath, abdominal pain.  She admits to slight decreased appetite and water intake but otherwise denies any acute illness.  Home Medications Prior to Admission medications   Medication Sig Start Date End Date Taking? Authorizing Provider  Ferrous Sulfate (IRON PO) Take 20 mLs by mouth daily. FLOVITA IRON LIQUID    [provider]  gabapentin (NEURONTIN) 300 MG capsule Take 1 capsule (300 mg total) by mouth 3 (three) times daily for 2 days. 05/20/20 05/22/20  Meisinger, Tawanna Cooler, MD  hydrochlorothiazide (MICROZIDE) 12.5 MG capsule Take 12.5 mg by mouth daily.  09/11/18   [provider]  ibuprofen (ADVIL) 600 MG tablet Take 1 tablet (600 mg total) by mouth every 6 (six) hours. 05/21/20   Meisinger, Tawanna Cooler, MD  oxyCODONE (OXY IR/ROXICODONE) 5 MG immediate release tablet Take 1 tablet (5 mg total) by mouth every 4 (four) hours as needed for severe pain. 05/20/20   Meisinger, Tawanna Cooler, MD  Potassium Chloride ER 20 MEQ TBCR Take by mouth.    [provider]  valACYclovir (VALTREX) 500 MG tablet Take 500 mg by mouth 2 (two) times daily as needed (For outbreaks.).     [provider]      Allergies    Patient has no known allergies.    Review of Systems   Review of Systems  Constitutional:  Positive for appetite change. Negative for  fever.  Respiratory:  Negative for shortness of breath.   Cardiovascular:  Negative for chest pain.  Gastrointestinal:  Negative for abdominal pain, diarrhea and vomiting.  Skin:  Negative for rash.  Neurological:  Positive for dizziness and light-headedness. Negative for tremors, syncope, facial asymmetry, speech difficulty, weakness, numbness and headaches.    Physical Exam Updated Vital Signs BP 125/80   Pulse 70   Temp 98.4 F (36.9 C)   Resp 16   Wt 83.9 kg   LMP 04/30/2020   SpO2 96%   BMI 37.37 kg/m  Physical Exam Vitals and nursing note reviewed.  Constitutional:      General: She is not in acute distress.    Appearance: Normal appearance. She is not ill-appearing.  HENT:     Head: Normocephalic.     Mouth/Throat:     Mouth: Mucous membranes are moist.  Cardiovascular:     Rate and Rhythm: Normal rate.  Pulmonary:     Effort: Pulmonary effort is normal. No respiratory distress.  Abdominal:     Palpations: Abdomen is soft.     Tenderness: There is no abdominal tenderness.  Skin:    General: Skin is warm.  Neurological:     Mental Status: She is alert and oriented to person, place, and time. Mental status is at baseline.  Psychiatric:        Mood and Affect: Mood normal.     ED Results / Procedures /  Treatments   Labs (all labs ordered are listed, but only abnormal results are displayed) Labs Reviewed  CBC - Abnormal; Notable for the following components:      Result Value   RBC 5.43 (*)    All other components within normal limits  BASIC METABOLIC PANEL - Abnormal; Notable for the following components:   Calcium 8.7 (*)    All other components within normal limits  TROPONIN I (HIGH SENSITIVITY)  TROPONIN I (HIGH SENSITIVITY)    EKG EKG Interpretation Date/Time:  Thursday March 17 2023 13:30:52 EST Ventricular Rate:  72 PR Interval:  153 QRS Duration:  88 QT Interval:  387 QTC Calculation: 424 R Axis:   64  Text Interpretation: Sinus  rhythm Low voltage, precordial leads Confirmed by Coralee Pesa [phone]) on 03/17/2023 2:55:56 PM  Radiology DG Chest 2 View Result Date: 03/17/2023 CLINICAL DATA:  CP EXAM: CHEST - 2 VIEW COMPARISON:  July 16, 2018 FINDINGS: The cardiomediastinal silhouette is unchanged in contour. No pleural effusion. No pneumothorax. No acute pleuroparenchymal abnormality. Visualized abdomen is unremarkable. Mild degenerative changes of the thoracic spine. IMPRESSION: No acute cardiopulmonary abnormality. Electronically Signed   By: Meda Klinefelter M.D.   On: 03/17/2023 12:19    Procedures Procedures    Medications Ordered in ED Medications - No data to display  ED Course/ Medical Decision Making/ A&P                                 Medical Decision Making Amount and/or Complexity of Data Reviewed Labs: ordered. Radiology: ordered.   38 year old female presents emergency department after an episode of dizziness. emergency department after self resolved episode of dizziness.  She was tachycardic in triage but this is resolved on my evaluation without any intervention.  EKG shows normal sinus rhythm with no acute changes.  Blood work is reassuring, troponins are negative.  She has had a hysterectomy and is not on any birth control, I doubt PE at this time as she is currently PERC negative and has no chest pain or shortness of breath.  Patient feels mildly dehydrated, discussed rehydration protocols and continued outpatient evaluation by her primary doctor.  Patient at this time appears safe and stable for discharge and close outpatient follow up. Discharge plan and strict return to ED precautions discussed, patient verbalizes understanding and agreement.        Final Clinical Impression(s) / ED Diagnoses Final diagnoses:  Lightheadedness    Rx / DC Orders ED Discharge Orders     None         Rozelle Logan, DO 03/17/23 1504

## 2023-03-17 NOTE — ED Triage Notes (Signed)
Pt bib g[facility] with c/o dizziness off and on x 1 week. Denies dizziness at this time. Also reports  CP under breasts

## 2023-04-01 DIAGNOSIS — H6991 Unspecified Eustachian tube disorder, right ear: Secondary | ICD-10-CM | POA: Diagnosis not present

## 2023-08-03 DIAGNOSIS — Z01419 Encounter for gynecological examination (general) (routine) without abnormal findings: Secondary | ICD-10-CM | POA: Diagnosis not present

## 2023-08-03 DIAGNOSIS — Z113 Encounter for screening for infections with a predominantly sexual mode of transmission: Secondary | ICD-10-CM | POA: Diagnosis not present

## 2023-08-03 DIAGNOSIS — Z130 Encounter for screening for diseases of the blood and blood-forming organs and certain disorders involving the immune mechanism: Secondary | ICD-10-CM | POA: Diagnosis not present

## 2023-08-24 ENCOUNTER — Emergency Department (HOSPITAL_BASED_OUTPATIENT_CLINIC_OR_DEPARTMENT_OTHER)
Admission: EM | Admit: 2023-08-24 | Discharge: 2023-08-25 | Disposition: A | Discharge status: Home / Self Care | Attending: Emergency Medicine | Admitting: Emergency Medicine

## 2023-08-24 ENCOUNTER — Encounter (HOSPITAL_BASED_OUTPATIENT_CLINIC_OR_DEPARTMENT_OTHER): Payer: Self-pay | Admitting: Emergency Medicine

## 2023-08-24 ENCOUNTER — Other Ambulatory Visit: Payer: Self-pay

## 2023-08-24 DIAGNOSIS — I10 Essential (primary) hypertension: Secondary | ICD-10-CM | POA: Diagnosis not present

## 2023-08-24 DIAGNOSIS — R42 Dizziness and giddiness: Secondary | ICD-10-CM | POA: Insufficient documentation

## 2023-08-24 LAB — CBC
HCT: 40.4 % (ref 36.0–46.0)
Hemoglobin: 13.3 g/dL (ref 12.0–15.0)
MCH: 26.3 pg (ref 26.0–34.0)
MCHC: 32.9 g/dL (ref 30.0–36.0)
MCV: 80 fL (ref 80.0–100.0)
Platelets: 346 K/uL (ref 150–400)
RBC: 5.05 MIL/uL (ref 3.87–5.11)
RDW: 13.7 % (ref 11.5–15.5)
WBC: 6.5 K/uL (ref 4.0–10.5)
nRBC: 0 % (ref 0.0–0.2)

## 2023-08-24 LAB — BASIC METABOLIC PANEL WITH GFR
Anion gap: 12 (ref 5–15)
BUN: 10 mg/dL (ref 6–20)
CO2: 23 mmol/L (ref 22–32)
Calcium: 9.1 mg/dL (ref 8.9–10.3)
Chloride: 105 mmol/L (ref 98–111)
Creatinine, Ser: 0.95 mg/dL (ref 0.44–1.00)
GFR, Estimated: >60 mL/min (ref >60)
Glucose, Bld: 85 mg/dL (ref 70–99)
Potassium: 3.7 mmol/L (ref 3.5–5.1)
Sodium: 139 mmol/L (ref 135–145)

## 2023-08-24 NOTE — ED Triage Notes (Signed)
 Light-headed x2 weeks. Denies N/V/D, no bleeding or stool changes.

## 2023-08-25 LAB — URINALYSIS, ROUTINE W REFLEX MICROSCOPIC
Bilirubin Urine: NEGATIVE
Glucose, UA: NEGATIVE mg/dL
Hgb urine dipstick: NEGATIVE
Ketones, ur: NEGATIVE mg/dL
Leukocytes,Ua: NEGATIVE
Nitrite: NEGATIVE
Protein, ur: NEGATIVE mg/dL
Specific Gravity, Urine: 1.023 (ref 1.005–1.030)
pH: 6.5 (ref 5.0–8.0)

## 2023-08-25 NOTE — ED Provider Notes (Signed)
 [place] EMERGENCY DEPARTMENT AT [facility] Provider Note   CSN: [id number] Arrival date & time: 08/24/23  2156     Patient presents with: Fatigue   Caroline Jordan is a 38 y.o. female.   HPI     This is a 38 year old female who presents with lightheadedness.  Patient reports intermittent lightheadedness over the last 2 weeks.  She describes it as feeling off balance but not room spinning dizziness.  She thought she was not drinking enough or that it might be related to dehydration.  She has not had any related nausea.  Denies chest pain, shortness of breath, recent illnesses.  Has not really taken anything for her symptoms.  Prior to Admission medications   Medication Sig Start Date End Date Taking? Authorizing Provider  Ferrous Sulfate (IRON PO) Take 20 mLs by mouth daily. FLOVITA IRON LIQUID    [provider]  gabapentin  (NEURONTIN ) 300 MG capsule Take 1 capsule (300 mg total) by mouth 3 (three) times daily for 2 days. 05/20/20 05/22/20  Meisinger, Krystal, MD  hydrochlorothiazide  (MICROZIDE ) 12.5 MG capsule Take 12.5 mg by mouth daily.  09/11/18   [provider]  ibuprofen  (ADVIL ) 600 MG tablet Take 1 tablet (600 mg total) by mouth every 6 (six) hours. 05/21/20   Meisinger, Krystal, MD  oxyCODONE  (OXY IR/ROXICODONE ) 5 MG immediate release tablet Take 1 tablet (5 mg total) by mouth every 4 (four) hours as needed for severe pain. 05/20/20   Meisinger, Krystal, MD  Potassium Chloride  ER 20 MEQ TBCR Take by mouth.    [provider]  valACYclovir (VALTREX) 500 MG tablet Take 500 mg by mouth 2 (two) times daily as needed (For outbreaks.).     [provider]    Allergies: Patient has no known allergies.    Review of Systems  Constitutional:  Negative for fever.  Respiratory:  Negative for shortness of breath.   Cardiovascular:  Negative for chest pain.  Gastrointestinal:  Negative for abdominal pain, diarrhea, nausea and vomiting.   Neurological:  Positive for light-headedness. Negative for dizziness and headaches.  All other systems reviewed and are negative.   Updated Vital Signs BP 116/87   Pulse 65   Temp 97.7 F (36.5 C) (Oral)   Resp 13   LMP 04/30/2020   SpO2 100%   Physical Exam Vitals and nursing note reviewed.  Constitutional:      Appearance: She is well-developed.  HENT:     Head: Normocephalic and atraumatic.  Eyes:     Pupils: Pupils are equal, round, and reactive to light.  Cardiovascular:     Rate and Rhythm: Normal rate and regular rhythm.     Heart sounds: Normal heart sounds.  Pulmonary:     Effort: Pulmonary effort is normal. No respiratory distress.     Breath sounds: No wheezing.  Abdominal:     General: Bowel sounds are normal.     Palpations: Abdomen is soft.  Musculoskeletal:     Cervical back: Neck supple.  Skin:    General: Skin is warm and dry.  Neurological:     Mental Status: She is alert and oriented to person, place, and time.  Psychiatric:        Mood and Affect: Mood normal.     (all labs ordered are listed, but only abnormal results are displayed) Labs Reviewed  CBC  BASIC METABOLIC PANEL WITH GFR  URINALYSIS, ROUTINE W REFLEX MICROSCOPIC    EKG: EKG Interpretation Date/Time:  Wednesday August 24 2023 23:20:06 EDT Ventricular Rate:  66 PR Interval:  175 QRS Duration:  85 QT Interval:  385 QTC Calculation: 404 R Axis:   64  Text Interpretation: Sinus rhythm Low voltage, precordial leads Confirmed by Bari Pfeiffer ([id number]) on 08/25/2023 12:23:46 AM  Radiology: No results found.   Procedures   Medications Ordered in the ED - No data to display                                  Medical Decision Making Amount and/or Complexity of Data Reviewed Labs: ordered.   This patient presents to the ED for concern of lightheadedness, this involves an extensive number of treatment options, and is a complaint that carries with it a high risk of  complications and morbidity.  I considered the following differential and admission for this acute, potentially life threatening condition.  The differential diagnosis includes dehydration, metabolic derangement, heat associated illness, likely vertigo or central cause, anemia  MDM:    This is a 38 year old female who presents with intermittent lightheadedness.  Nontoxic and vital signs are reassuring.  She appears well-hydrated on exam.  EKG without acute arrhythmia or ischemia.  Basic lab work without evidence of anemia or metabolic derangement.  Urinalysis without ketones to suggest significant dehydration.  Statics were negative.  Unclear etiology.  Patient currently asymptomatic.  Recommend close follow-up with primary physician.  (Labs, imaging, consults)  Labs: I Ordered, and personally interpreted labs.  The pertinent results include: CBC, urinalysis BMP,  Imaging Studies ordered: I ordered imaging studies including none I independently visualized and interpreted imaging. I agree with the radiologist interpretation  Additional history obtained from chart review.  External records from outside source obtained and reviewed including prior evaluations  Cardiac Monitoring: The patient was maintained on a cardiac monitor.  If on the cardiac monitor, I personally viewed and interpreted the cardiac monitored which showed an underlying rhythm of: Sinus  Reevaluation: After the interventions noted above, I reevaluated the patient and found that they have :stayed the same  Social Determinants of Health:  lives independently  Disposition: Discharge  Co morbidities that complicate the patient evaluation  Past Medical History:  Diagnosis Date   Anemia    History of trichomoniasis    HSV infection    HTN (hypertension)    Menorrhagia    Pregnancy induced hypertension    Tachycardia    NO ISSUES SINCE MAY 2021 per pt   Wears glasses      Medicines No orders of the defined types  were placed in this encounter.   I have reviewed the patients home medicines and have made adjustments as needed  Problem List / ED Course: Problem List Items Addressed This Visit   None Visit Diagnoses       Lightheadedness    -  Primary                Final diagnoses:  Lightheadedness    ED Discharge Orders     None          Amato Sevillano, Pfeiffer FALCON, MD 08/25/23 [phone]

## 2023-08-25 NOTE — Discharge Instructions (Signed)
 You were seen today for lightheadedness.  Your workup today is reassuring.  Make sure that you are staying well-hydrated.  Eat frequent small meals.  Avoid extreme heat.  Follow-up closely with your primary doctor.

## 2023-09-02 DIAGNOSIS — Z113 Encounter for screening for infections with a predominantly sexual mode of transmission: Secondary | ICD-10-CM | POA: Diagnosis not present
